# Patient Record
Sex: Female | Born: 1965 | Race: White | Hispanic: Yes | Marital: Married | Smoking: Never smoker
Health system: Southern US, Community
[De-identification: ages and names within clinical notes are randomized; demographics above are authoritative.]

## PROBLEM LIST (undated history)

## (undated) ENCOUNTER — Emergency Department (HOSPITAL_BASED_OUTPATIENT_CLINIC_OR_DEPARTMENT_OTHER): Admission: EM | Payer: BC Managed Care – PPO | Source: Home / Self Care

## (undated) DIAGNOSIS — K529 Noninfective gastroenteritis and colitis, unspecified: Secondary | ICD-10-CM

## (undated) DIAGNOSIS — G935 Compression of brain: Secondary | ICD-10-CM

## (undated) DIAGNOSIS — R739 Hyperglycemia, unspecified: Secondary | ICD-10-CM

## (undated) HISTORY — DX: Hyperglycemia, unspecified: R73.9

## (undated) HISTORY — DX: Compression of brain: G93.5

## (undated) HISTORY — PX: ABDOMINAL HYSTERECTOMY: SHX81

---

## 1988-09-24 DIAGNOSIS — N809 Endometriosis, unspecified: Secondary | ICD-10-CM

## 1988-09-24 HISTORY — DX: Endometriosis, unspecified: N80.9

## 1993-09-24 HISTORY — PX: ABDOMINAL HYSTERECTOMY: SHX81

## 1993-09-24 HISTORY — PX: APPENDECTOMY: SHX54

## 1996-09-24 HISTORY — PX: SMALL INTESTINE SURGERY: SHX150

## 1998-07-11 ENCOUNTER — Emergency Department (HOSPITAL_COMMUNITY): Admission: EM | Admit: 1998-07-11 | Discharge: 1998-07-11 | Payer: Self-pay | Admitting: Emergency Medicine

## 1998-07-25 ENCOUNTER — Encounter: Admission: RE | Admit: 1998-07-25 | Discharge: 1998-09-27 | Payer: Self-pay | Admitting: Emergency Medicine

## 1998-09-06 ENCOUNTER — Encounter (HOSPITAL_COMMUNITY): Admission: RE | Admit: 1998-09-06 | Discharge: 1998-12-05 | Payer: Self-pay | Admitting: *Deleted

## 2005-08-03 ENCOUNTER — Emergency Department (HOSPITAL_COMMUNITY): Admission: EM | Admit: 2005-08-03 | Discharge: 2005-08-03 | Payer: Self-pay | Admitting: Family Medicine

## 2006-07-17 ENCOUNTER — Other Ambulatory Visit: Admission: RE | Admit: 2006-07-17 | Discharge: 2006-07-17 | Payer: Self-pay | Admitting: Obstetrics and Gynecology

## 2007-07-14 ENCOUNTER — Encounter: Admission: RE | Admit: 2007-07-14 | Discharge: 2007-07-14 | Payer: Self-pay | Admitting: Obstetrics and Gynecology

## 2008-11-08 ENCOUNTER — Inpatient Hospital Stay (HOSPITAL_COMMUNITY): Admission: EM | Admit: 2008-11-08 | Discharge: 2008-11-11 | Payer: Self-pay | Admitting: Emergency Medicine

## 2009-06-17 ENCOUNTER — Encounter: Admission: RE | Admit: 2009-06-17 | Discharge: 2009-06-17 | Payer: Self-pay | Admitting: Obstetrics and Gynecology

## 2009-07-30 ENCOUNTER — Emergency Department (HOSPITAL_COMMUNITY): Admission: EM | Admit: 2009-07-30 | Discharge: 2009-07-31 | Payer: Self-pay | Admitting: Emergency Medicine

## 2010-10-15 ENCOUNTER — Encounter: Payer: Self-pay | Admitting: Obstetrics and Gynecology

## 2010-12-27 LAB — DIFFERENTIAL
Basophils Relative: 0 % (ref 0–1)
Eosinophils Absolute: 0 10*3/uL (ref 0.0–0.7)
Eosinophils Relative: 0 % (ref 0–5)
Lymphocytes Relative: 7 % — ABNORMAL LOW (ref 12–46)
Lymphs Abs: 1.4 10*3/uL (ref 0.7–4.0)
Monocytes Absolute: 0.6 10*3/uL (ref 0.1–1.0)
Monocytes Relative: 3 % (ref 3–12)
Neutrophils Relative %: 89 % — ABNORMAL HIGH (ref 43–77)

## 2010-12-27 LAB — CBC
Hemoglobin: 12.8 g/dL (ref 12.0–15.0)
Platelets: 221 10*3/uL (ref 150–400)
RBC: 5.54 MIL/uL — ABNORMAL HIGH (ref 3.87–5.11)

## 2010-12-27 LAB — COMPREHENSIVE METABOLIC PANEL
AST: 27 U/L (ref 0–37)
CO2: 26 mEq/L (ref 19–32)
Calcium: 9.7 mg/dL (ref 8.4–10.5)
Chloride: 102 mEq/L (ref 96–112)
GFR calc Af Amer: >60 mL/min (ref >60)
Glucose, Bld: 136 mg/dL — ABNORMAL HIGH (ref 70–99)
Potassium: 3.9 mEq/L (ref 3.5–5.1)
Sodium: 139 mEq/L (ref 135–145)
Total Bilirubin: 0.9 mg/dL (ref 0.3–1.2)
Total Protein: 7.7 g/dL (ref 6.0–8.3)

## 2010-12-27 LAB — URINALYSIS, ROUTINE W REFLEX MICROSCOPIC
Glucose, UA: NEGATIVE mg/dL
Hgb urine dipstick: NEGATIVE
Leukocytes, UA: NEGATIVE
Nitrite: NEGATIVE
Specific Gravity, Urine: 1.031 — ABNORMAL HIGH (ref 1.005–1.030)
Urobilinogen, UA: 0.2 mg/dL (ref 0.0–1.0)

## 2010-12-27 LAB — URINE CULTURE: Colony Count: NO GROWTH

## 2010-12-27 LAB — URINE MICROSCOPIC-ADD ON

## 2011-01-09 LAB — CBC
HCT: 42.3 % (ref 36.0–46.0)
HCT: 34.9 % — ABNORMAL LOW (ref 36.0–46.0)
Hemoglobin: 13.8 g/dL (ref 12.0–15.0)
Hemoglobin: 11.6 g/dL — ABNORMAL LOW (ref 12.0–15.0)
MCHC: 32.5 g/dL (ref 30.0–36.0)
MCV: 72.6 fL — ABNORMAL LOW (ref 78.0–100.0)
Platelets: 273 10*3/uL (ref 150–400)
Platelets: 213 10*3/uL (ref 150–400)
RBC: 5.82 MIL/uL — ABNORMAL HIGH (ref 3.87–5.11)
RDW: 15 % (ref 11.5–15.5)
RDW: 14.8 % (ref 11.5–15.5)
WBC: 16.8 10*3/uL — ABNORMAL HIGH (ref 4.0–10.5)
WBC: 7.1 10*3/uL (ref 4.0–10.5)

## 2011-01-09 LAB — COMPREHENSIVE METABOLIC PANEL
ALT: 16 U/L (ref 0–35)
ALT: 17 U/L (ref 0–35)
AST: 35 U/L (ref 0–37)
AST: 19 U/L (ref 0–37)
Albumin: 4.3 g/dL (ref 3.5–5.2)
Albumin: 3.3 g/dL — ABNORMAL LOW (ref 3.5–5.2)
Alkaline Phosphatase: 54 U/L (ref 39–117)
Alkaline Phosphatase: 42 U/L (ref 39–117)
BUN: 14 mg/dL (ref 6–23)
CO2: 26 mEq/L (ref 19–32)
CO2: 26 mEq/L (ref 19–32)
Calcium: 10.1 mg/dL (ref 8.4–10.5)
Chloride: 101 mEq/L (ref 96–112)
Chloride: 105 mEq/L (ref 96–112)
Creatinine, Ser: 0.77 mg/dL (ref 0.4–1.2)
Creatinine, Ser: 0.71 mg/dL (ref 0.4–1.2)
GFR calc Af Amer: >60 mL/min (ref >60)
GFR calc Af Amer: >60 mL/min (ref >60)
GFR calc non Af Amer: >60 mL/min (ref >60)
GFR calc non Af Amer: >60 mL/min (ref >60)
Glucose, Bld: 189 mg/dL — ABNORMAL HIGH (ref 70–99)
Potassium: 5.1 mEq/L (ref 3.5–5.1)
Potassium: 3.3 mEq/L — ABNORMAL LOW (ref 3.5–5.1)
Sodium: 142 mEq/L (ref 135–145)
Total Bilirubin: 1.2 mg/dL (ref 0.3–1.2)
Total Bilirubin: 0.7 mg/dL (ref 0.3–1.2)
Total Protein: 8 g/dL (ref 6.0–8.3)

## 2011-01-09 LAB — RAPID URINE DRUG SCREEN, HOSP PERFORMED
Amphetamines: NOT DETECTED
Barbiturates: NOT DETECTED
Benzodiazepines: NOT DETECTED

## 2011-01-09 LAB — URINE MICROSCOPIC-ADD ON

## 2011-01-09 LAB — DIFFERENTIAL
Basophils Absolute: 0 10*3/uL (ref 0.0–0.1)
Basophils Absolute: 0 10*3/uL (ref 0.0–0.1)
Basophils Relative: 0 % (ref 0–1)
Eosinophils Absolute: 0.1 10*3/uL (ref 0.0–0.7)
Eosinophils Relative: 0 % (ref 0–5)
Eosinophils Relative: 1 % (ref 0–5)
Lymphocytes Relative: 14 % (ref 12–46)
Lymphocytes Relative: 31 % (ref 12–46)
Lymphs Abs: 2.3 10*3/uL (ref 0.7–4.0)
Lymphs Abs: 2.2 10*3/uL (ref 0.7–4.0)
Monocytes Absolute: 0.7 10*3/uL (ref 0.1–1.0)
Monocytes Relative: 4 % (ref 3–12)
Neutro Abs: 13.7 10*3/uL — ABNORMAL HIGH (ref 1.7–7.7)
Neutro Abs: 4.3 10*3/uL (ref 1.7–7.7)
Neutrophils Relative %: 81 % — ABNORMAL HIGH (ref 43–77)

## 2011-01-09 LAB — BASIC METABOLIC PANEL
BUN: 4 mg/dL — ABNORMAL LOW (ref 6–23)
BUN: 5 mg/dL — ABNORMAL LOW (ref 6–23)
CO2: 26 mEq/L (ref 19–32)
CO2: 26 mEq/L (ref 19–32)
Calcium: 8.6 mg/dL (ref 8.4–10.5)
Calcium: 9.1 mg/dL (ref 8.4–10.5)
Chloride: 105 mEq/L (ref 96–112)
Chloride: 106 mEq/L (ref 96–112)
Creatinine, Ser: 0.6 mg/dL (ref 0.4–1.2)
Creatinine, Ser: 0.62 mg/dL (ref 0.4–1.2)
GFR calc Af Amer: >60 mL/min (ref >60)
Glucose, Bld: 90 mg/dL (ref 70–99)
Glucose, Bld: 99 mg/dL (ref 70–99)

## 2011-01-09 LAB — GLUCOSE, CAPILLARY

## 2011-01-09 LAB — POCT I-STAT, CHEM 8
BUN: 19 mg/dL (ref 6–23)
Chloride: 105 mEq/L (ref 96–112)
HCT: 47 % — ABNORMAL HIGH (ref 36.0–46.0)
Sodium: 140 mEq/L (ref 135–145)

## 2011-01-09 LAB — URINALYSIS, ROUTINE W REFLEX MICROSCOPIC
Glucose, UA: NEGATIVE mg/dL
Ketones, ur: 15 mg/dL — AB
Nitrite: NEGATIVE
Protein, ur: 100 mg/dL — AB
Urobilinogen, UA: 0.2 mg/dL (ref 0.0–1.0)

## 2011-01-09 LAB — PROTIME-INR
INR: 0.9 (ref 0.00–1.49)
Prothrombin Time: 12.2 seconds (ref 11.6–15.2)

## 2011-01-09 LAB — POTASSIUM: Potassium: 4.1 mEq/L (ref 3.5–5.1)

## 2011-01-09 LAB — POCT CARDIAC MARKERS
Myoglobin, poc: 30.9 ng/mL (ref 12–200)
Troponin i, poc: <0.05 ng/mL (ref 0.00–0.09)

## 2011-01-09 LAB — APTT: aPTT: 23 seconds — ABNORMAL LOW (ref 24–37)

## 2011-01-09 LAB — PREGNANCY, URINE: Preg Test, Ur: NEGATIVE

## 2011-02-06 NOTE — H&P (Signed)
[name], [name]            ACCOUNT NO.:  [id number]   MEDICAL RECORD NO.:  [id number]          PATIENT TYPE:  INP   LOCATION:  1823                         FACILITY:  [facility]   PHYSICIAN:  Della Goo, M.D. DATE OF BIRTH:  August 10, 1966   DATE OF ADMISSION:  11/08/2008  DATE OF DISCHARGE:                              HISTORY & PHYSICAL   PRIMARY CARE PHYSICIAN:  None.   CHIEF COMPLAINT:  Abdominal pain.   HISTORY OF PRESENT ILLNESS:  This is a 45 year old female who presents  to the emergency department secondary to complaints of sudden crampy  abdominal pain which started 30 minutes after eating a homemade soup  made of shrimp and scallops.  She states that she began to have severe  crampy abdominal pain which continued to worsen over time and then she  began to have nausea and vomiting.  She denies having any hematemesis  and also denies having any diarrhea, melena or hematochezia.  She also  denies having any fevers or chills.  Of note, the patient's husband ate  the same soup and has no symptoms.   The patient was seen in the emergency department and evaluated and was  found to be hypotensive and suffered several syncopal episodes.  The  patient was treated with IV fluid resuscitation and underwent a very  extensive workup and was referred for admission.   PAST MEDICAL HISTORY:  1. Endometriosis in the past status post hysterectomy and appendectomy      at age 32.  2. A self-reported history of a small-bowel obstruction status post      surgical resection.   MEDICATIONS:  None.   ALLERGIES:  Anaprox which causes swelling and redness.   SOCIAL HISTORY:  The patient is married.  She is a nonsmoker,  nondrinker.   FAMILY HISTORY:  Noncontributory.   REVIEW OF SYSTEMS:  Pertinents are mentioned above in the HPI.   PHYSICAL EXAMINATION FINDINGS:  GENERAL:  This is a sickly appearing 22-  year-old female who is well-nourished, well-developed, who is in  discomfort in mild distress.  VITAL SIGNS:  Temperature is 99.4 rectally, blood pressure initially  132/79 now it is 92/57, heart rate 83-94, respirations 28 now 16, O2  saturations 99-100%.  HEENT:  Examination normocephalic, atraumatic.  There is no scleral  icterus.  Pupils are equally round reactive to light.  Extraocular  movements are intact.  Funduscopic benign.  Nares are patent  bilaterally.  Oropharynx is clear.  NECK:  Supple, full range of motion.  No thyromegaly, adenopathy,  jugular venous distention.  CARDIOVASCULAR:  Regular rate and rhythm.  No murmurs, gallops or rubs.  LUNGS:  Clear to auscultation bilaterally.  ABDOMEN:  Positive bowel sounds which are mildly hypoactive.  She has  diffuse tenderness across the abdomen.  There is no rebound or guarding.  No hepatosplenomegaly.  EXTREMITIES:  Without cyanosis, clubbing or edema.  BACK:  No costovertebral angle tenderness.   LABORATORY STUDIES:  White blood cell count 16.8, hemoglobin 13.8,  hematocrit 42.3 and platelets 273, MCV 72.6, neutrophils 81%,  lymphocytes 14%.  Sodium 142, potassium 5.1, chloride 101,  bicarb 26,  BUN 14, creatinine 0.77 and glucose 189, albumin 4.3, AST 35, ALT 16,  lipase level 21.  Protime 12.2, INR 0.9 and PTT 23.  Urinalysis reveals  trace leukocyte esterase.  Urine drug screen negative.  Acute abdominal  x-rays negative for free air or obstruction and CT scan of the abdomen  and pelvis revealed jejunal thickening.  No acute changes, otherwise.  Surgical changes from a prior hysterectomy are present.   ASSESSMENT:  A 45 year old female being admitted with:  1. Abdominal pain.  2. Nausea and vomiting.  3. Jejunitis on CT scan of the abdomen and pelvis.  4. Leukocytosis.  5. Orthostatic Hypotension   PLAN:  The patient will be admitted, placed on bowel rest and antiemetic  therapy.  IV fluids have also been ordered for both rehydration and  fluid resuscitation.  The patient will be  placed on DVT and GI  prophylaxis as well.  Further workup will ensue pending results of the  patient's clinical course and her studies.      Della Goo, M.D.  Electronically Signed     HJ/MEDQ  D:  11/08/2008  T:  11/08/2008  Job:  [id number]

## 2011-02-06 NOTE — Discharge Summary (Signed)
[name], [name]            ACCOUNT NO.:  [id number]   MEDICAL RECORD NO.:  [id number]          PATIENT TYPE:  INP   LOCATION:  5507                         FACILITY:  [facility]   PHYSICIAN:  Eduard Clos, MDDATE OF BIRTH:  1966/02/15   DATE OF ADMISSION:  11/07/2008  DATE OF DISCHARGE:  11/11/2008                               DISCHARGE SUMMARY   COURSE IN THE HOSPITAL:  A 45 year old female with no significant past  medical history presented with abdominal pain after having home-made  soup of shrimp and scallops.  The pain was severe, crampy type.  The  patient on admission had a CAT scan of the abdomen and pelvis, which  showed jejunitis.   The patient was admitted to medical floor, started on IV fluids,  initiated kept n.p.o. and slowly started on clear liquid diet.  My  colleague, Marcellus Scott, did speak to a gastroenterologist who felt  that the patient probably had viral gastroenteritis and felt no  intervention is required at this time.   The patient's symptoms slowly improved, and the patient was tolerating  diet, which was advanced.  At this time, the patient is tolerating a  regular diet.  The patient also had some headache and said she did have  migraines before and improved with Fioricet.  At this time as the  patient is able to tolerate diet, pain has improved and has moved bowel,  so the patient will be discharged home.   PROCEDURE DURING THIS STAY:  CT abdomen and pelvis on November 08, 2008,  shows a jejunal enteritis, a small amount of likely reactive fluid in  the small bowel mesenteric leads.  Pelvis shows nothing acute.  The  patient has had a hysterectomy.   FINAL DIAGNOSES:  1. Possible acute gastroenteritis with jejunitis.  2. Dehydration.  3. Headache with a history of migraine.   MEDICATIONS AT DISCHARGE:  1. Omeprazole 40 mg p.o. daily.  2. Tramadol 50 mg p.o. t.i.d. p.r.n. for pain.   PLAN:  The patient is advised to follow up with  [facility] as  scheduled, to initially be on bland diet, and increase diet as  tolerated.  In the event of any recurrence of symptoms, the patient is  advised to go to the nearest ER.      Eduard Clos, MD  Electronically Signed     ANK/MEDQ  D:  11/11/2008  T:  11/12/2008  Job:  [id number]

## 2011-11-30 ENCOUNTER — Other Ambulatory Visit: Payer: Self-pay | Admitting: Emergency Medicine

## 2011-12-04 ENCOUNTER — Other Ambulatory Visit: Payer: Self-pay | Admitting: Emergency Medicine

## 2011-12-04 DIAGNOSIS — N644 Mastodynia: Secondary | ICD-10-CM

## 2011-12-10 ENCOUNTER — Ambulatory Visit
Admission: RE | Admit: 2011-12-10 | Discharge: 2011-12-10 | Disposition: A | Discharge status: Home / Self Care | Payer: Self-pay | Source: Ambulatory Visit | Attending: Emergency Medicine | Admitting: Emergency Medicine

## 2011-12-10 ENCOUNTER — Other Ambulatory Visit: Payer: Self-pay | Admitting: Emergency Medicine

## 2011-12-10 DIAGNOSIS — N644 Mastodynia: Secondary | ICD-10-CM

## 2018-03-19 ENCOUNTER — Ambulatory Visit
Admission: RE | Admit: 2018-03-19 | Discharge: 2018-03-19 | Disposition: A | Discharge status: Home / Self Care | Payer: No Typology Code available for payment source | Source: Ambulatory Visit | Attending: Physician Assistant | Admitting: Physician Assistant

## 2018-03-19 ENCOUNTER — Other Ambulatory Visit: Payer: Self-pay | Admitting: Physician Assistant

## 2018-03-19 DIAGNOSIS — Z1231 Encounter for screening mammogram for malignant neoplasm of breast: Secondary | ICD-10-CM

## 2018-10-09 ENCOUNTER — Encounter (HOSPITAL_COMMUNITY): Payer: Self-pay | Admitting: Emergency Medicine

## 2018-10-09 ENCOUNTER — Ambulatory Visit (HOSPITAL_COMMUNITY)
Admission: EM | Admit: 2018-10-09 | Discharge: 2018-10-09 | Disposition: A | Discharge status: Home / Self Care | Payer: No Typology Code available for payment source | Attending: Emergency Medicine | Admitting: Emergency Medicine

## 2018-10-09 DIAGNOSIS — N6320 Unspecified lump in the left breast, unspecified quadrant: Secondary | ICD-10-CM | POA: Insufficient documentation

## 2018-10-09 DIAGNOSIS — N644 Mastodynia: Secondary | ICD-10-CM | POA: Insufficient documentation

## 2018-10-09 MED ORDER — GABAPENTIN 300 MG PO CAPS: ORAL_CAPSULE | ORAL | 0 refills | Status: DC

## 2018-10-09 MED ORDER — IBUPROFEN 600 MG PO TABS: 600.0000 mg | ORAL_TABLET | Freq: Four times a day (QID) | ORAL | 0 refills | Status: DC | PRN

## 2018-10-09 NOTE — ED Provider Notes (Signed)
HPI  SUBJECTIVE:  [name] is a 53 y.o. female who presents with 7 months of intermittent right breast pain.  States that it lasts from several seconds up to 30 minutes at a time.  She states that is becoming more frequent and became worse today.  She describes the pain as sharp, burning, located over the entire mid right breast and extending into her axilla.  She reports a lateral palpable mass but is not sure if it is new.  She states that she does breast self exams occasionally.  No trauma, rash, history of shingles, fevers, night sweats, skin changes, nipple discharge, erythema, swelling, unintentional weight loss.  No upper back pain, cough.  She has had symptoms like this before had a negative mammogram and ultrasound in 2013 and a another negative mammogram in June 2019.  She has not tried anything for this.  Symptoms are better with holding her breast.  No aggravating factors.  It is not associated with movement of her arm.  Past medical history negative for breast surgery, cancer, diabetes.  Family history negative for breast cancer.  PMD: None.    History reviewed. No pertinent past medical history.  Past Surgical History:  Procedure Laterality Date  . ABDOMINAL HYSTERECTOMY      History reviewed. No pertinent family history.  Social History   Tobacco Use  . Smoking status: Never Smoker  . Smokeless tobacco: Never Used  Substance Use Topics  . Alcohol use: Not Currently  . Drug use: Never    No current facility-administered medications for this encounter.   Current Outpatient Medications:  .  gabapentin (NEURONTIN) 300 MG capsule, 1 tab po at bedtime 1st day, 1 tablet bid second day, then 1 tablet tid, Disp: 30 capsule, Rfl: 0 .  ibuprofen (ADVIL,MOTRIN) 600 MG tablet, Take 1 tablet (600 mg total) by mouth every 6 (six) hours as needed., Disp: 30 tablet, Rfl: 0  Allergies  Allergen Reactions  . Anaprox [Naproxen] Other (See Comments)    "I don't remember"      ROS  As noted in HPI.   Physical Exam  BP 122/70 (BP Location: Right Arm)   Pulse 83   Temp 99.5 F (37.5 C)   Resp 18   SpO2 100%   Constitutional: Well developed, well nourished, no acute distress Eyes:  EOMI, conjunctiva normal bilaterally HENT: Normocephalic, atraumatic,mucus membranes moist Respiratory: Normal inspiratory effort, lungs clear bilaterally, good air movement. Cardiovascular: Normal rate regular rhythm, no murmurs, rubs, gallops  Breasts: Breasts symmetric, no erythema, skin changes.  No nipple discharge.  No rash.  Tenderness along the midline of the right breast extending up into the axilla.  No appreciable masses. Painful tender mobile mass in the 2 o'clock position upper left quadrant of left breast.  Otherwise breast exam normal. Lymph: No axillary, supraclavicular, cervical lymphadenopathy. GI: nondistended skin: No rash, skin intact Musculoskeletal: no deformities Neurologic: Alert & oriented x 3, no focal neuro deficits Psychiatric: Speech and behavior appropriate  ED Course   Medications - No data to display  No orders of the defined types were placed in this encounter.   No results found for this or any previous visit (from the past 24 hour(s)). No results found.  ED Clinical Impression  Breast pain, right  Mass of left breast   ED Assessment/Plan  Reviewed mammogram results from 03/19/2018.  Normal except for dense breast tissue.   It does not appear to be shingles, abscess, infection.  Her right breast appears  completely normal.  Pain sounds very neuropathic, will try ibuprofen 600 mg combined with 1 g of Tylenol 3 or 4 times a day as needed, also some Neurontin.  Will provide a primary care referral list, ordering a diagnostic Mammogram with ultrasound if necessary because of the left breast mass which I assume is new.  Discussed , MDM, treatment plan, and plan for follow-up with patient.  patient agrees with plan.   Meds  ordered this encounter  Medications  . gabapentin (NEURONTIN) 300 MG capsule    Sig: 1 tab po at bedtime 1st day, 1 tablet bid second day, then 1 tablet tid    Dispense:  30 capsule    Refill:  0  . ibuprofen (ADVIL,MOTRIN) 600 MG tablet    Sig: Take 1 tablet (600 mg total) by mouth every 6 (six) hours as needed.    Dispense:  30 tablet    Refill:  0    *This clinic note was created using Scientist, clinical (histocompatibility and immunogenetics). Therefore, there may be occasional mistakes despite careful proofreading.   ?   Domenick Gong, MD 10/10/18 [phone]

## 2018-10-09 NOTE — ED Triage Notes (Signed)
Pt presents to [facility] for assessment of right breast pain since December, intermittent in nature.  But patient states pain is more intense today than it has been.  Possible lump to right side of breast, pain is more towards center, worse with palpation.  Denies nipple discharge.

## 2018-10-09 NOTE — Discharge Instructions (Addendum)
I have ordered a diagnostic mammogram with an ultrasound if necessary.  These results will need to be followed up on by a primary care provider.  You can go to the [facility] clinic or follow-up with anyone of the primary care physicians listed below.  Try the ibuprofen 600 mg combined with 1 g of Tylenol together 3 or 4 times a day as needed for pain, the Neurontin may also help.  Below is a list of primary care practices who are taking new patients for you to follow-up with. [facility] [address] [place], [place] [address] [phone]  [facility] [phone] [address] [place] [address]  [facility]: [address] [place] [address]  [phone]  [facility]: [address] [place] [address]   [phone]  [facility]: [address] [place] [address]  [phone]  [facility] : [address] [place] [address] [phone]  [facility]: [address] [place] [address] [phone]  [facility]: [address] [place] [address] [phone]  Dr. Oneal Grout [address] [facility] [place] [address]  [phone]  Dr. Jackie Plum, [facility]. [address] [place], [place] [address]  [phone]  Go to www.goodrx.com to look up your medications. This will give you a list of where you can find your prescriptions at the most affordable prices. Or ask the pharmacist what the cash price is, or if they have any other discount programs available to help make your medication more affordable. This can be less expensive than what you would pay with insurance.

## 2018-10-22 ENCOUNTER — Other Ambulatory Visit: Payer: Self-pay | Admitting: *Deleted

## 2018-10-22 DIAGNOSIS — N6320 Unspecified lump in the left breast, unspecified quadrant: Secondary | ICD-10-CM

## 2019-01-16 ENCOUNTER — Ambulatory Visit (INDEPENDENT_AMBULATORY_CARE_PROVIDER_SITE_OTHER): Payer: Self-pay | Admitting: Family Medicine

## 2019-01-16 ENCOUNTER — Other Ambulatory Visit: Payer: Self-pay

## 2019-01-16 ENCOUNTER — Encounter: Payer: Self-pay | Admitting: Family Medicine

## 2019-01-16 VITALS — BP 102/67 | HR 77 | Temp 98.4°F | Resp 14 | Ht 64.0 in | Wt 143.0 lb

## 2019-01-16 DIAGNOSIS — Z7689 Persons encountering health services in other specified circumstances: Secondary | ICD-10-CM

## 2019-01-16 DIAGNOSIS — M546 Pain in thoracic spine: Secondary | ICD-10-CM

## 2019-01-16 MED ORDER — KETOROLAC TROMETHAMINE 60 MG/2ML IM SOLN
60.0000 mg | Freq: Once | INTRAMUSCULAR | Status: AC
  Administered 2019-01-16: 60 mg via INTRAMUSCULAR

## 2019-01-16 MED ORDER — IBUPROFEN 800 MG PO TABS: 800.0000 mg | ORAL_TABLET | Freq: Three times a day (TID) | ORAL | 0 refills | Status: DC | PRN

## 2019-01-16 MED ORDER — CYCLOBENZAPRINE HCL 10 MG PO TABS: 10.0000 mg | ORAL_TABLET | Freq: Three times a day (TID) | ORAL | 0 refills | Status: DC | PRN

## 2019-01-16 NOTE — Progress Notes (Signed)
[facility]  New Patient Encounter Provider: Mike Gipndre Irish Breisch, FNP    WUJ:[id number]  NFA:[id number]  DOB - 1966/09/24  SUBJECTIVE:   Christina PattenSandra Powell, is a 53 y.o. female who presents to establish care with this clinic.   Current problems/concerns:  Patient reports having right sided pain in the neck, back and right breast x 1 month. Patient was seen in January 2020 at urgent care and had ultrasound ordered. Uninsured and did not get it performed due to cost. Patient denies histroy of familial female cancers. Patient states that she was given 600mg  ibuprofen without relief. Patient states that she had Xanax from her sister who is ill with lung cancer. She states that the xanax helps her breast pain but not the back pain.   Allergies  Allergen Reactions  . Anaprox [Naproxen] Other (See Comments)    "I don't remember"   History reviewed. No pertinent past medical history. Current Outpatient Medications on File Prior to Visit  Medication Sig Dispense Refill  . gabapentin (NEURONTIN) 300 MG capsule 1 tab po at bedtime 1st day, 1 tablet bid second day, then 1 tablet tid (Patient not taking: Reported on 01/16/2019) 30 capsule 0   No current facility-administered medications on file prior to visit.    Family History  Problem Relation Age of Onset  . Cancer Mother        liver cancer   . Cancer Sister    Social History   Socioeconomic History  . Marital status: Married    Spouse name: Not on file  . Number of children: Not on file  . Years of education: Not on file  . Highest education level: Not on file  Occupational History  . Not on file  Social Needs  . Financial resource strain: Not on file  . Food insecurity:    Worry: Not on file    Inability: Not on file  . Transportation needs:    Medical: Not on file    Non-medical: Not on file  Tobacco Use  . Smoking status: Never Smoker  . Smokeless tobacco: Never Used  Substance  and Sexual Activity  . Alcohol use: Not Currently  . Drug use: Never  . Sexual activity: Not on file  Lifestyle  . Physical activity:    Days per week: Not on file    Minutes per session: Not on file  . Stress: Not on file  Relationships  . Social connections:    Talks on phone: Not on file    Gets together: Not on file    Attends religious service: Not on file    Active member of club or organization: Not on file    Attends meetings of clubs or organizations: Not on file    Relationship status: Not on file  . Intimate partner violence:    Fear of current or ex partner: Not on file    Emotionally abused: Not on file    Physically abused: Not on file    Forced sexual activity: Not on file  Other Topics Concern  . Not on file  Social History Narrative  . Not on file    Review of Systems  Constitutional: Negative.   HENT: Negative.   Eyes: Negative.   Respiratory: Negative.   Cardiovascular: Negative.   Gastrointestinal: Negative.   Genitourinary: Negative.   Musculoskeletal: Positive for back pain (breast pain).  Skin: Negative.   Neurological: Negative.   Psychiatric/Behavioral: Negative.  OBJECTIVE:    BP 102/67 (BP Location: Right Arm, Patient Position: Sitting, Cuff Size: Normal)   Pulse 77   Temp 98.4 F (36.9 C) (Oral)   Resp 14   Ht 5\' 4"  (1.626 m)   Wt 143 lb (64.9 kg)   SpO2 100%   BMI 24.55 kg/m   Physical Exam  Constitutional: She is oriented to person, place, and time and well-developed, well-nourished, and in no distress. No distress.  HENT:  Head: Normocephalic and atraumatic.  Eyes: Pupils are equal, round, and reactive to light. Conjunctivae and EOM are normal.  Neck: Normal range of motion. Neck supple.  Cardiovascular: Normal rate, regular rhythm and intact distal pulses. Exam reveals no gallop and no friction rub.  No murmur heard. Pulmonary/Chest: Effort normal and breath sounds normal. No respiratory distress. She has no wheezes.  Right breast exhibits tenderness. Left breast exhibits tenderness.    Abdominal: Soft. Bowel sounds are normal. There is no abdominal tenderness.  Musculoskeletal: Normal range of motion.        General: No tenderness or edema.     Right shoulder: She exhibits pain and spasm.     Thoracic back: She exhibits spasm.  Lymphadenopathy:    She has no cervical adenopathy.  Neurological: She is alert and oriented to person, place, and time. Gait normal.  Skin: Skin is warm and dry.  Psychiatric: Mood, memory, affect and judgment normal.  Nursing note and vitals reviewed.    ASSESSMENT/PLAN:  1. Acute right-sided thoracic back pain - cyclobenzaprine (FLEXERIL) 10 MG tablet; Take 1 tablet (10 mg total) by mouth 3 (three) times daily as needed for muscle spasms.  Dispense: 30 tablet; Refill: 0 - ibuprofen (ADVIL) 800 MG tablet; Take 1 tablet (800 mg total) by mouth every 8 (eight) hours as needed.  Dispense: 30 tablet; Refill: 0 - ketorolac (TORADOL) injection 60 mg  2. Establishing care with new doctor, encounter for - CBC with Differential - Comprehensive metabolic panel   Return in about 6 months (around 07/18/2019).  The patient was given clear instructions to go to ER or return to medical center if symptoms don't improve, worsen or new problems develop. The patient verbalized understanding. The patient was told to call to get lab results if they haven't heard anything in the next week.     This note has been created with Education officer, environmental. Any transcriptional errors are unintentional.   Ms. Andr L. Riley Lam, FNP-BC [facility] [address] [place], [place] [address] [phone]

## 2019-01-16 NOTE — Patient Instructions (Signed)
Evening Primrose Oil can help with breast pain. You can take this at night. It is recommended that you take 3-4 grams per day.   Thoracic Strain  Thoracic strain is an injury to the muscles or tendons that attach to the upper back. A strain can be mild or severe. A mild strain may take only 1-2 weeks to heal. A severe strain involves torn muscles or tendons, so it may take 6-8 weeks to heal. Follow these instructions at home:  Rest as needed. Limit your activity as told by your doctor.  If directed, put ice on the injured area: ? Put ice in a plastic bag. ? Place a towel between your skin and the bag. ? Leave the ice on for 20 minutes, 2-3 times per day.  Take over-the-counter and prescription medicines only as told by your doctor.  Begin doing exercises as told by your doctor or physical therapist.  Warm up before being active.  Bend your knees before you lift heavy objects.  Keep all follow-up visits as told by your doctor. This is important. Contact a doctor if:  Your pain is not helped by medicine.  Your pain, bruising, or swelling is getting worse.  You have a fever. Get help right away if:  You have shortness of breath.  You have chest pain.  You have weakness or loss of feeling (numbness) in your legs.  You cannot control when you pee (urinate). This information is not intended to replace advice given to you by your health care provider. Make sure you discuss any questions you have with your health care provider. Document Released: 02/27/2008 Document Revised: 05/12/2016 Document Reviewed: 11/04/2014 Elsevier Interactive Patient Education  2019 [employer].

## 2019-01-17 LAB — COMPREHENSIVE METABOLIC PANEL
ALT: 20 IU/L (ref 0–32)
AST: 16 IU/L (ref 0–40)
Albumin/Globulin Ratio: 2 (ref 1.2–2.2)
Albumin: 4.7 g/dL (ref 3.8–4.9)
Alkaline Phosphatase: 46 IU/L (ref 39–117)
BUN/Creatinine Ratio: 24 — ABNORMAL HIGH (ref 9–23)
BUN: 16 mg/dL (ref 6–24)
Bilirubin Total: 0.3 mg/dL (ref 0.0–1.2)
CO2: 24 mmol/L (ref 20–29)
Calcium: 9.8 mg/dL (ref 8.7–10.2)
Chloride: 105 mmol/L (ref 96–106)
Creatinine, Ser: 0.67 mg/dL (ref 0.57–1.00)
GFR calc Af Amer: 117 mL/min/{1.73_m2} (ref >59)
GFR calc non Af Amer: 101 mL/min/{1.73_m2} (ref >59)
Globulin, Total: 2.4 g/dL (ref 1.5–4.5)
Glucose: 118 mg/dL — ABNORMAL HIGH (ref 65–99)
Potassium: 4.1 mmol/L (ref 3.5–5.2)
Sodium: 144 mmol/L (ref 134–144)
Total Protein: 7.1 g/dL (ref 6.0–8.5)

## 2019-01-17 LAB — CBC WITH DIFFERENTIAL/PLATELET
Basophils Absolute: 0 10*3/uL (ref 0.0–0.2)
Basos: 1 %
EOS (ABSOLUTE): 0 10*3/uL (ref 0.0–0.4)
Eos: 0 %
Hematocrit: 40 % (ref 34.0–46.6)
Hemoglobin: 12.3 g/dL (ref 11.1–15.9)
Immature Grans (Abs): 0 10*3/uL (ref 0.0–0.1)
Immature Granulocytes: 0 %
Lymphocytes Absolute: 1.7 10*3/uL (ref 0.7–3.1)
Lymphs: 22 %
MCH: 22.4 pg — ABNORMAL LOW (ref 26.6–33.0)
MCHC: 30.8 g/dL — ABNORMAL LOW (ref 31.5–35.7)
MCV: 73 fL — ABNORMAL LOW (ref 79–97)
Monocytes Absolute: 0.5 10*3/uL (ref 0.1–0.9)
Monocytes: 7 %
Neutrophils Absolute: 5.3 10*3/uL (ref 1.4–7.0)
Neutrophils: 70 %
Platelets: 261 10*3/uL (ref 150–450)
RBC: 5.49 x10E6/uL — ABNORMAL HIGH (ref 3.77–5.28)
RDW: 14.4 % (ref 11.7–15.4)
WBC: 7.6 10*3/uL (ref 3.4–10.8)

## 2019-01-19 ENCOUNTER — Telehealth: Payer: Self-pay

## 2019-01-19 NOTE — Telephone Encounter (Signed)
-----   Message from Mike Gip, FNP sent at 01/19/2019  4:08 PM EDT ----- Please let patient know that her labs show mild anemia. She can take a multivitamin with iron for this.

## 2019-01-19 NOTE — Telephone Encounter (Signed)
Called and spoke with patient, advised that labs show mild anemia and that she can take a multivitamin with iron to improve this. Patient verbalized understanding. Thanks!

## 2019-02-02 ENCOUNTER — Telehealth: Payer: Self-pay

## 2019-02-02 NOTE — Telephone Encounter (Signed)
Patient called and is saying the flexeril makes her too drowsy and she can not take it and work. She says she has taken skelaxin in the past and is asking if this she can try this instead? Please advise.

## 2019-02-03 MED ORDER — METAXALONE 800 MG PO TABS: 800.0000 mg | ORAL_TABLET | Freq: Three times a day (TID) | ORAL | 0 refills | Status: AC | PRN

## 2019-02-03 NOTE — Telephone Encounter (Signed)
Called and spoke with patient. Advised that skelaxin has been sent to pharmacy and to take as directed. Thanks!

## 2019-02-26 ENCOUNTER — Encounter: Payer: Self-pay | Admitting: Family Medicine

## 2019-04-02 ENCOUNTER — Other Ambulatory Visit: Payer: Self-pay | Admitting: *Deleted

## 2019-04-02 ENCOUNTER — Telehealth: Payer: Self-pay

## 2019-04-02 DIAGNOSIS — N644 Mastodynia: Secondary | ICD-10-CM

## 2019-04-02 NOTE — Telephone Encounter (Signed)
Patient needs an order for Mammogram. She is also experiencing pain in both breast. She was evaluated for this at last appointment. Can you put diagnostic mamo in and [place] for both breast?

## 2019-04-13 ENCOUNTER — Other Ambulatory Visit: Payer: Self-pay | Admitting: Family Medicine

## 2019-04-13 DIAGNOSIS — N644 Mastodynia: Secondary | ICD-10-CM

## 2019-04-16 ENCOUNTER — Other Ambulatory Visit: Payer: Self-pay | Admitting: *Deleted

## 2019-04-16 DIAGNOSIS — N644 Mastodynia: Secondary | ICD-10-CM

## 2019-04-17 ENCOUNTER — Ambulatory Visit
Admission: RE | Admit: 2019-04-17 | Discharge: 2019-04-17 | Disposition: A | Discharge status: Home / Self Care | Payer: No Typology Code available for payment source | Source: Ambulatory Visit | Attending: Family Medicine | Admitting: Family Medicine

## 2019-04-17 ENCOUNTER — Other Ambulatory Visit: Payer: Self-pay

## 2019-04-17 ENCOUNTER — Ambulatory Visit: Payer: Self-pay

## 2019-04-17 DIAGNOSIS — N644 Mastodynia: Secondary | ICD-10-CM

## 2019-04-20 ENCOUNTER — Other Ambulatory Visit: Payer: Self-pay

## 2019-06-03 ENCOUNTER — Encounter (HOSPITAL_COMMUNITY): Payer: Self-pay | Admitting: *Deleted

## 2019-06-03 ENCOUNTER — Encounter (HOSPITAL_COMMUNITY): Payer: Self-pay

## 2019-07-20 ENCOUNTER — Ambulatory Visit: Payer: Self-pay | Admitting: Family Medicine

## 2019-07-31 ENCOUNTER — Other Ambulatory Visit: Payer: Self-pay

## 2019-07-31 ENCOUNTER — Encounter: Payer: Self-pay | Admitting: Family Medicine

## 2019-07-31 ENCOUNTER — Ambulatory Visit (INDEPENDENT_AMBULATORY_CARE_PROVIDER_SITE_OTHER): Payer: Self-pay | Admitting: Family Medicine

## 2019-07-31 VITALS — BP 122/69 | HR 74 | Temp 97.6°F | Ht 64.0 in | Wt 146.0 lb

## 2019-07-31 DIAGNOSIS — R739 Hyperglycemia, unspecified: Secondary | ICD-10-CM

## 2019-07-31 DIAGNOSIS — Z0000 Encounter for general adult medical examination without abnormal findings: Secondary | ICD-10-CM

## 2019-07-31 DIAGNOSIS — Z09 Encounter for follow-up examination after completed treatment for conditions other than malignant neoplasm: Secondary | ICD-10-CM

## 2019-07-31 LAB — POCT URINALYSIS DIPSTICK
Bilirubin, UA: NEGATIVE
Blood, UA: NEGATIVE
Glucose, UA: NEGATIVE
Ketones, UA: NEGATIVE
Leukocytes, UA: NEGATIVE
Nitrite, UA: NEGATIVE
Protein, UA: NEGATIVE
Spec Grav, UA: >=1.03 — AB (ref 1.010–1.025)
Urobilinogen, UA: 0.2 E.U./dL
pH, UA: 5.5 (ref 5.0–8.0)

## 2019-07-31 LAB — POCT GLYCOSYLATED HEMOGLOBIN (HGB A1C): Hemoglobin A1C: 5.7 % — AB (ref 4.0–5.6)

## 2019-07-31 LAB — GLUCOSE, POCT (MANUAL RESULT ENTRY): POC Glucose: 115 mg/dl — AB (ref 70–99)

## 2019-07-31 NOTE — Progress Notes (Signed)
[facility]   Established Patient Office Visit  Subjective:  Patient ID: Christina PattenSandra Powell, female    DOB: [birth year]  Age: 53 y.o. MRN: [id number]  CC:  Chief Complaint  Patient presents with  . Follow-up    Former Debby BudAndre patient    HPI Christina PattenSandra Powell is a 53 year old female who presents for Follow Up today  Past Medical History:  Diagnosis Date  . Hyperglycemia      Current Status: This will be [name]'s initial office visit with me. Her was previously seeing Mike GipAndre Douglas, NP for her PCP needs. Since her last office visit, she is doing well with no complaints. She denies fevers, chills, fatigue, recent infections, weight loss, and night sweats. She has not had any headaches, visual changes, dizziness, and falls. No chest pain, heart palpitations, cough and shortness of breath reported. No reports of GI problems such as nausea, vomiting, diarrhea, and constipation. She has no reports of blood in stools, dysuria and hematuria. No depression or anxiety reported today. She denies pain today.   Past Surgical History:  Procedure Laterality Date  . ABDOMINAL HYSTERECTOMY      Family History  Problem Relation Age of Onset  . Cancer Mother        liver cancer   . Cancer Sister     Social History   Socioeconomic History  . Marital status: Married    Spouse name: Not on file  . Number of children: Not on file  . Years of education: Not on file  . Highest education level: Not on file  Occupational History  . Not on file  Social Needs  . Financial resource strain: Not on file  . Food insecurity    Worry: Not on file    Inability: Not on file  . Transportation needs    Medical: Not on file    Non-medical: Not on file  Tobacco Use  . Smoking status: Never Smoker  . Smokeless tobacco: Never Used  Substance and Sexual Activity  . Alcohol use: Not Currently  . Drug use: Never  . Sexual activity: Not on file  Lifestyle  .  Physical activity    Days per week: Not on file    Minutes per session: Not on file  . Stress: Not on file  Relationships  . Social Musicianconnections    Talks on phone: Not on file    Gets together: Not on file    Attends religious service: Not on file    Active member of club or organization: Not on file    Attends meetings of clubs or organizations: Not on file    Relationship status: Not on file  . Intimate partner violence    Fear of current or ex partner: Not on file    Emotionally abused: Not on file    Physically abused: Not on file    Forced sexual activity: Not on file  Other Topics Concern  . Not on file  Social History Narrative  . Not on file    Outpatient Medications Prior to Visit  Medication Sig Dispense Refill  . ibuprofen (ADVIL) 800 MG tablet Take 1 tablet (800 mg total) by mouth every 8 (eight) hours as needed. 30 tablet 0  . cyclobenzaprine (FLEXERIL) 10 MG tablet Take 1 tablet (10 mg total) by mouth 3 (three) times daily as needed for muscle spasms. 30 tablet 0  . gabapentin (NEURONTIN) 300 MG capsule 1 tab  po at bedtime 1st day, 1 tablet bid second day, then 1 tablet tid (Patient not taking: Reported on 01/16/2019) 30 capsule 0   No facility-administered medications prior to visit.     Allergies  Allergen Reactions  . Anaprox [Naproxen] Other (See Comments)    "I don't remember"    ROS Review of Systems  Constitutional: Negative.   HENT: Negative.   Eyes: Negative.   Respiratory: Negative.   Cardiovascular: Negative.   Gastrointestinal: Negative.   Endocrine: Negative.   Genitourinary: Negative.   Musculoskeletal: Negative.   Skin: Negative.   Allergic/Immunologic: Negative.   Neurological: Negative.   Hematological: Negative.   Psychiatric/Behavioral: Negative.       Objective:    Physical Exam  Constitutional: She is oriented to person, place, and time. She appears well-developed and well-nourished.  HENT:  Head: Normocephalic and  atraumatic.  Eyes: Conjunctivae are normal.  Neck: Normal range of motion. Neck supple.  Cardiovascular: Normal rate, regular rhythm, normal heart sounds and intact distal pulses.  Pulmonary/Chest: Effort normal and breath sounds normal.  Abdominal: Soft. Bowel sounds are normal.  Musculoskeletal: Normal range of motion.  Neurological: She is alert and oriented to person, place, and time. She has normal reflexes.  Skin: Skin is warm and dry.  Psychiatric: She has a normal mood and affect. Her behavior is normal. Judgment and thought content normal.  Nursing note and vitals reviewed.   BP 122/69   Pulse 74   Temp 97.6 F (36.4 C) (Oral)   Ht 5\' 4"  (1.626 m)   Wt 146 lb (66.2 kg)   SpO2 99%   BMI 25.06 kg/m  Wt Readings from Last 3 Encounters:  07/31/19 146 lb (66.2 kg)  01/16/19 143 lb (64.9 kg)     Health Maintenance Due  Topic Date Due  . HIV Screening  05/19/1981  . COLONOSCOPY  05/19/2016  . INFLUENZA VACCINE  04/25/2019    There are no preventive care reminders to display for this patient.  Lab Results  Component Value Date   TSH 3.090 07/31/2019   Lab Results  Component Value Date   WBC 7.6 01/16/2019   HGB 12.3 01/16/2019   HCT 40.0 01/16/2019   MCV 73 (L) 01/16/2019   PLT 261 01/16/2019   Lab Results  Component Value Date   NA 144 01/16/2019   K 4.1 01/16/2019   CO2 24 01/16/2019   GLUCOSE 118 (H) 01/16/2019   BUN 16 01/16/2019   CREATININE 0.67 01/16/2019   BILITOT 0.3 01/16/2019   ALKPHOS 46 01/16/2019   AST 16 01/16/2019   ALT 20 01/16/2019   PROT 7.1 01/16/2019   ALBUMIN 4.7 01/16/2019   CALCIUM 9.8 01/16/2019   Lab Results  Component Value Date   CHOL 194 07/31/2019   Lab Results  Component Value Date   HDL 50 07/31/2019   Lab Results  Component Value Date   LDLCALC 120 (H) 07/31/2019   Lab Results  Component Value Date   TRIG 135 07/31/2019   Lab Results  Component Value Date   CHOLHDL 3.9 07/31/2019   Lab Results   Component Value Date   HGBA1C 5.7 (A) 07/31/2019      Assessment & Plan:   1. Hyperglycemia Blood glucose level is 115 today. Hgb A1c is stable at 5.7. She will continue medication as prescribed, to decrease foods/beverages high in sugars and carbs and follow Heart Healthy or DASH diet. Increase physical activity to at least 30 minutes cardio exercise daily.  2. Healthcare maintenance - Urinalysis Dipstick - POCT HgB A1C - Glucose (CBG) - TSH - Lipid Panel - Vitamin B12 - Vitamin D, 25-hydroxy  3. Follow up She will follow up in 6 months.   No orders of the defined types were placed in this encounter.   Orders Placed This Encounter  Procedures  . TSH  . Lipid Panel  . Vitamin B12  . Vitamin D, 25-hydroxy  . Urinalysis Dipstick  . POCT HgB A1C  . Glucose (CBG)    Referral Orders  No referral(s) requested today    Raliegh Ip,  MSN, FNP-BC [facility] [address] [place], [place] [address] [phone]- fax   Problem List Items Addressed This Visit    None    Visit Diagnoses    Hyperglycemia    -  Primary   Healthcare maintenance       Relevant Orders   Urinalysis Dipstick (Completed)   POCT HgB A1C (Completed)   Glucose (CBG) (Completed)   TSH (Completed)   Lipid Panel (Completed)   Vitamin B12 (Completed)   Vitamin D, 25-hydroxy (Completed)   Follow up          No orders of the defined types were placed in this encounter.   Follow-up: No follow-ups on file.    Kallie Locks, FNP

## 2019-08-01 LAB — TSH: TSH: 3.09 u[IU]/mL (ref 0.450–4.500)

## 2019-08-01 LAB — LIPID PANEL
Chol/HDL Ratio: 3.9 ratio (ref 0.0–4.4)
Cholesterol, Total: 194 mg/dL (ref 100–199)
HDL: 50 mg/dL (ref >39)
LDL Chol Calc (NIH): 120 mg/dL — ABNORMAL HIGH (ref 0–99)
Triglycerides: 135 mg/dL (ref 0–149)
VLDL Cholesterol Cal: 24 mg/dL (ref 5–40)

## 2019-08-01 LAB — VITAMIN D 25 HYDROXY (VIT D DEFICIENCY, FRACTURES): Vit D, 25-Hydroxy: 27.8 ng/mL — ABNORMAL LOW (ref 30.0–100.0)

## 2019-08-01 LAB — VITAMIN B12: Vitamin B-12: 453 pg/mL (ref 232–1245)

## 2019-08-02 ENCOUNTER — Encounter: Payer: Self-pay | Admitting: Family Medicine

## 2019-08-02 DIAGNOSIS — R739 Hyperglycemia, unspecified: Secondary | ICD-10-CM | POA: Insufficient documentation

## 2020-01-23 DIAGNOSIS — R42 Dizziness and giddiness: Secondary | ICD-10-CM

## 2020-01-23 DIAGNOSIS — H9313 Tinnitus, bilateral: Secondary | ICD-10-CM

## 2020-01-23 HISTORY — DX: Dizziness and giddiness: R42

## 2020-01-23 HISTORY — DX: Tinnitus, bilateral: H93.13

## 2020-01-27 ENCOUNTER — Ambulatory Visit: Payer: No Typology Code available for payment source | Admitting: Family Medicine

## 2020-02-03 ENCOUNTER — Ambulatory Visit (INDEPENDENT_AMBULATORY_CARE_PROVIDER_SITE_OTHER): Payer: BC Managed Care – PPO | Admitting: Family Medicine

## 2020-02-03 ENCOUNTER — Other Ambulatory Visit: Payer: Self-pay

## 2020-02-03 ENCOUNTER — Encounter: Payer: Self-pay | Admitting: Family Medicine

## 2020-02-03 VITALS — BP 120/60 | HR 68 | Temp 98.0°F | Ht 64.0 in | Wt 148.2 lb

## 2020-02-03 DIAGNOSIS — R42 Dizziness and giddiness: Secondary | ICD-10-CM | POA: Insufficient documentation

## 2020-02-03 DIAGNOSIS — Z09 Encounter for follow-up examination after completed treatment for conditions other than malignant neoplasm: Secondary | ICD-10-CM

## 2020-02-03 DIAGNOSIS — H9313 Tinnitus, bilateral: Secondary | ICD-10-CM | POA: Insufficient documentation

## 2020-02-03 DIAGNOSIS — R739 Hyperglycemia, unspecified: Secondary | ICD-10-CM | POA: Diagnosis not present

## 2020-02-03 LAB — POCT URINALYSIS DIPSTICK
Bilirubin, UA: NEGATIVE
Blood, UA: NEGATIVE
Glucose, UA: NEGATIVE
Ketones, UA: NEGATIVE
Leukocytes, UA: NEGATIVE
Nitrite, UA: NEGATIVE
Protein, UA: NEGATIVE
Spec Grav, UA: 1.025 (ref 1.010–1.025)
Urobilinogen, UA: 0.2 E.U./dL
pH, UA: 7.5 (ref 5.0–8.0)

## 2020-02-03 LAB — POCT GLYCOSYLATED HEMOGLOBIN (HGB A1C): Hemoglobin A1C: 5.8 % — AB (ref 4.0–5.6)

## 2020-02-03 LAB — GLUCOSE, POCT (MANUAL RESULT ENTRY): POC Glucose: 101 mg/dl — AB (ref 70–99)

## 2020-02-03 NOTE — Progress Notes (Signed)
[facility]    Established Patient Office Visit  Subjective:  Patient ID: [name], female    DOB: 12-26-1965  Age: 54 y.o. MRN: [id number]  CC:  Chief Complaint  Patient presents with  . Follow-up    Hypeglycemia    HPI [name] is a 54 year old female who presents for Follow Up today.   Past Medical History:  Diagnosis Date  . Dizziness 01/2020  . Hyperglycemia   . Ringing in ears, bilateral 01/2020   Current Status: Since her last office visit, she is doing well with no complaints. She has chronic ringing in ears, which she also becomes dizzy. She denies fevers, chills, fatigue, recent infections, weight loss, and night sweats. She has not had any headaches, visual changes, dizziness, and falls. No chest pain, heart palpitations, cough and shortness of breath reported. Denies GI problems such as nausea, vomiting, diarrhea, and constipation. She has no reports of blood in stools, dysuria and hematuria. No depression or anxiety reported today. She denies suicidal ideations, homicidal ideations, or auditory hallucinations. She is taking all medications as prescribed. She denies pain today.   Past Surgical History:  Procedure Laterality Date  . ABDOMINAL HYSTERECTOMY      Family History  Problem Relation Age of Onset  . Cancer Mother        liver cancer   . Cancer Sister     Social History   Socioeconomic History  . Marital status: Married    Spouse name: Not on file  . Number of children: Not on file  . Years of education: Not on file  . Highest education level: Not on file  Occupational History  . Not on file  Tobacco Use  . Smoking status: Never Smoker  . Smokeless tobacco: Never Used  Substance and Sexual Activity  . Alcohol use: Not Currently  . Drug use: Never  . Sexual activity: Yes  Other Topics Concern  . Not on file  Social History Narrative  . Not on file   Social Determinants of Health    Financial Resource Strain:   . Difficulty of Paying Living Expenses:   Food Insecurity:   . Worried About Programme researcher, broadcasting/film/video in the Last Year:   . Barista in the Last Year:   Transportation Needs:   . Freight forwarder (Medical):   Marland Kitchen Lack of Transportation (Non-Medical):   Physical Activity:   . Days of Exercise per Week:   . Minutes of Exercise per Session:   Stress:   . Feeling of Stress :   Social Connections:   . Frequency of Communication with Friends and Family:   . Frequency of Social Gatherings with Friends and Family:   . Attends Religious Services:   . Active Member of Clubs or Organizations:   . Attends Banker Meetings:   Marland Kitchen Marital Status:   Intimate Partner Violence:   . Fear of Current or Ex-Partner:   . Emotionally Abused:   Marland Kitchen Physically Abused:   . Sexually Abused:     Outpatient Medications Prior to Visit  Medication Sig Dispense Refill  . ibuprofen (ADVIL) 800 MG tablet Take 1 tablet (800 mg total) by mouth every 8 (eight) hours as needed. 30 tablet 0   No facility-administered medications prior to visit.    Allergies  Allergen Reactions  . Anaprox [Naproxen] Other (See Comments)    "I don't remember"    ROS  Review of Systems  Constitutional: Negative.   HENT: Negative.        Ringing in ear.  Eyes: Negative.   Respiratory: Negative.   Cardiovascular: Negative.   Gastrointestinal: Negative.   Endocrine: Negative.   Genitourinary: Negative.   Musculoskeletal: Negative.   Skin: Negative.   Allergic/Immunologic: Negative.   Neurological: Positive for dizziness (occasional ) and headaches (occasional ).  Hematological: Negative.       Objective:    Physical Exam  Constitutional: She is oriented to person, place, and time. She appears well-developed and well-nourished.  HENT:  Head: Normocephalic and atraumatic.  Eyes: Conjunctivae are normal.  Cardiovascular: Normal rate and regular rhythm.  Pulmonary/Chest:  Effort normal and breath sounds normal.  Abdominal: Soft. Bowel sounds are normal.  Musculoskeletal:        General: Normal range of motion.     Cervical back: Normal range of motion and neck supple.  Neurological: She is alert and oriented to person, place, and time. She has normal reflexes.  Skin: Skin is warm and dry.  Psychiatric: She has a normal mood and affect. Her behavior is normal. Judgment and thought content normal.  Nursing note and vitals reviewed.   BP 120/60   Pulse 68   Temp 98 F (36.7 C)   Ht 5\' 4"  (1.626 m)   Wt 148 lb 3.2 oz (67.2 kg)   SpO2 100%   BMI 25.44 kg/m  Wt Readings from Last 3 Encounters:  02/03/20 148 lb 3.2 oz (67.2 kg)  07/31/19 146 lb (66.2 kg)  01/16/19 143 lb (64.9 kg)     Health Maintenance Due  Topic Date Due  . HIV Screening  Never done  . TETANUS/TDAP  Never done  . PAP SMEAR-Modifier  Never done  . COLONOSCOPY  Never done    There are no preventive care reminders to display for this patient.  Lab Results  Component Value Date   TSH 3.090 07/31/2019   Lab Results  Component Value Date   WBC 7.6 01/16/2019   HGB 12.3 01/16/2019   HCT 40.0 01/16/2019   MCV 73 (L) 01/16/2019   PLT 261 01/16/2019   Lab Results  Component Value Date   NA 144 01/16/2019   K 4.1 01/16/2019   CO2 24 01/16/2019   GLUCOSE 118 (H) 01/16/2019   BUN 16 01/16/2019   CREATININE 0.67 01/16/2019   BILITOT 0.3 01/16/2019   ALKPHOS 46 01/16/2019   AST 16 01/16/2019   ALT 20 01/16/2019   PROT 7.1 01/16/2019   ALBUMIN 4.7 01/16/2019   CALCIUM 9.8 01/16/2019   Lab Results  Component Value Date   CHOL 194 07/31/2019   Lab Results  Component Value Date   HDL 50 07/31/2019   Lab Results  Component Value Date   LDLCALC 120 (H) 07/31/2019   Lab Results  Component Value Date   TRIG 135 07/31/2019   Lab Results  Component Value Date   CHOLHDL 3.9 07/31/2019   Lab Results  Component Value Date   HGBA1C 5.8 (A) 02/03/2020       Assessment & Plan:   1. Ringing in ear, bilateral - Ambulatory referral to ENT  2. Dizziness - Ambulatory referral to ENT  3. Hyperglycemia - POCT urinalysis dipstick - POCT glycosylated hemoglobin (Hb A1C) - POCT glucose (manual entry)  4. Follow up She will follow up in 6 months.   No orders of the defined types were placed in this encounter.   Orders Placed This  Encounter  Procedures  . Ambulatory referral to ENT  . POCT urinalysis dipstick  . POCT glycosylated hemoglobin (Hb A1C)  . POCT glucose (manual entry)     Referral Orders     Ambulatory referral to ENT   Kathe Becton,  MSN, FNP-BC [address], [place] [address] [phone]- fax   Problem List Items Addressed This Visit      Other   Hyperglycemia   Relevant Orders   POCT urinalysis dipstick (Completed)   POCT glycosylated hemoglobin (Hb A1C) (Completed)   POCT glucose (manual entry) (Completed)    Other Visit Diagnoses    Ringing in ear, bilateral    -  Primary   Relevant Orders   Ambulatory referral to ENT   Dizziness       Relevant Orders   Ambulatory referral to ENT   Follow up          No orders of the defined types were placed in this encounter.   Follow-up: Return in about 6 months (around 08/05/2020).    Azzie Glatter, FNP

## 2020-04-05 ENCOUNTER — Encounter (INDEPENDENT_AMBULATORY_CARE_PROVIDER_SITE_OTHER): Payer: Self-pay | Admitting: Otolaryngology

## 2020-04-05 ENCOUNTER — Ambulatory Visit (INDEPENDENT_AMBULATORY_CARE_PROVIDER_SITE_OTHER): Payer: BC Managed Care – PPO | Admitting: Otolaryngology

## 2020-04-05 ENCOUNTER — Other Ambulatory Visit: Payer: Self-pay

## 2020-04-05 VITALS — Temp 97.7°F

## 2020-04-05 DIAGNOSIS — H9313 Tinnitus, bilateral: Secondary | ICD-10-CM

## 2020-04-05 DIAGNOSIS — H93233 Hyperacusis, bilateral: Secondary | ICD-10-CM | POA: Diagnosis not present

## 2020-04-05 NOTE — Progress Notes (Signed)
HPI: [name] is a 54 y.o. female who presents is referred by her PCP for evaluation of complaints of intermittent ringing in her ears that she has had for a number of years.  She also complains of certain loud sounds being real bothersome to her and will sometimes make her dizzy.  She does not have any dizziness problems at night.  However loud sounds are bothersome to her.  She has not had a hearing test but has not noted any hearing problems. Denies exposure to loud sounds or explosions..  Past Medical History:  Diagnosis Date  . Dizziness 01/2020  . Hyperglycemia   . Ringing in ears, bilateral 01/2020   Past Surgical History:  Procedure Laterality Date  . ABDOMINAL HYSTERECTOMY     Social History   Socioeconomic History  . Marital status: Married    Spouse name: Not on file  . Number of children: Not on file  . Years of education: Not on file  . Highest education level: Not on file  Occupational History  . Not on file  Tobacco Use  . Smoking status: Never Smoker  . Smokeless tobacco: Never Used  Vaping Use  . Vaping Use: Never used  Substance and Sexual Activity  . Alcohol use: Not Currently  . Drug use: Never  . Sexual activity: Yes  Other Topics Concern  . Not on file  Social History Narrative  . Not on file   Social Determinants of Health   Financial Resource Strain:   . Difficulty of Paying Living Expenses:   Food Insecurity:   . Worried About Programme researcher, broadcasting/film/video in the Last Year:   . Barista in the Last Year:   Transportation Needs:   . Freight forwarder (Medical):   Marland Kitchen Lack of Transportation (Non-Medical):   Physical Activity:   . Days of Exercise per Week:   . Minutes of Exercise per Session:   Stress:   . Feeling of Stress :   Social Connections:   . Frequency of Communication with Friends and Family:   . Frequency of Social Gatherings with Friends and Family:   . Attends Religious Services:   . Active Member of Clubs or  Organizations:   . Attends Banker Meetings:   Marland Kitchen Marital Status:    Family History  Problem Relation Age of Onset  . Cancer Mother        liver cancer   . Cancer Sister    Allergies  Allergen Reactions  . Anaprox [Naproxen] Other (See Comments)    "I don't remember"   Prior to Admission medications   Medication Sig Start Date End Date Taking? Authorizing Provider  ibuprofen (ADVIL) 800 MG tablet Take 1 tablet (800 mg total) by mouth every 8 (eight) hours as needed. 01/16/19   Mike Gip, FNP     Positive ROS: Otherwise negative  All other systems have been reviewed and were otherwise negative with the exception of those mentioned in the HPI and as above.  Physical Exam: Constitutional: Alert, well-appearing, no acute distress Ears: External ears without lesions or tenderness. Ear canals are clear bilaterally with intact, clear TMs bilaterally.  On Dix-Hallpike testing she had no evidence of BPPV.  Auscultation of the ears revealed no objective tinnitus or sounds. Nasal: External nose without lesions. Clear nasal passages Oral: Lips and gums without lesions. Tongue and palate mucosa without lesions. Posterior oropharynx clear. Neck: No palpable adenopathy or masses Respiratory: Breathing comfortably  Skin: No  facial/neck lesions or rash noted.  Audiogram in the office today demonstrated normal hearing in both ears with SRT's of 10 dB bilaterally.  She had type A tympanograms bilaterally.  Procedures  Assessment: Tinnitus and hyperacusis.  Plan: Reviewed with patient concerning limited treatment options for tinnitus and her symptoms.  Reviewed with her concerning using masking noise when the tinnitus is bothersome.  Presently she is doing fairly well with the complaints but gave her information on referral to Tinnitus and sound sensitivity clinic held at [facility] if needed or interested in attending.   Narda Bonds, MD   CC:

## 2020-04-19 ENCOUNTER — Encounter (INDEPENDENT_AMBULATORY_CARE_PROVIDER_SITE_OTHER): Payer: Self-pay

## 2020-05-13 ENCOUNTER — Inpatient Hospital Stay (HOSPITAL_COMMUNITY): Payer: BC Managed Care – PPO

## 2020-05-13 ENCOUNTER — Encounter (HOSPITAL_COMMUNITY): Payer: Self-pay

## 2020-05-13 ENCOUNTER — Emergency Department (HOSPITAL_COMMUNITY): Payer: BC Managed Care – PPO

## 2020-05-13 ENCOUNTER — Inpatient Hospital Stay (HOSPITAL_COMMUNITY)
Admission: EM | Admit: 2020-05-13 | Discharge: 2020-05-19 | DRG: 390 | Disposition: A | Discharge status: Home / Self Care | Payer: BC Managed Care – PPO | Attending: General Surgery | Admitting: General Surgery

## 2020-05-13 ENCOUNTER — Other Ambulatory Visit: Payer: Self-pay

## 2020-05-13 DIAGNOSIS — R1013 Epigastric pain: Secondary | ICD-10-CM | POA: Diagnosis not present

## 2020-05-13 DIAGNOSIS — Z20822 Contact with and (suspected) exposure to covid-19: Secondary | ICD-10-CM | POA: Diagnosis not present

## 2020-05-13 DIAGNOSIS — K56609 Unspecified intestinal obstruction, unspecified as to partial versus complete obstruction: Principal | ICD-10-CM | POA: Diagnosis present

## 2020-05-13 DIAGNOSIS — Z9049 Acquired absence of other specified parts of digestive tract: Secondary | ICD-10-CM

## 2020-05-13 DIAGNOSIS — Z90710 Acquired absence of both cervix and uterus: Secondary | ICD-10-CM | POA: Diagnosis not present

## 2020-05-13 DIAGNOSIS — Z4682 Encounter for fitting and adjustment of non-vascular catheter: Secondary | ICD-10-CM | POA: Diagnosis not present

## 2020-05-13 DIAGNOSIS — R1084 Generalized abdominal pain: Secondary | ICD-10-CM | POA: Diagnosis not present

## 2020-05-13 DIAGNOSIS — K6389 Other specified diseases of intestine: Secondary | ICD-10-CM | POA: Diagnosis not present

## 2020-05-13 DIAGNOSIS — R519 Headache, unspecified: Secondary | ICD-10-CM | POA: Diagnosis not present

## 2020-05-13 DIAGNOSIS — K56699 Other intestinal obstruction unspecified as to partial versus complete obstruction: Secondary | ICD-10-CM | POA: Diagnosis not present

## 2020-05-13 DIAGNOSIS — I700 Atherosclerosis of aorta: Secondary | ICD-10-CM | POA: Diagnosis not present

## 2020-05-13 DIAGNOSIS — Z886 Allergy status to analgesic agent status: Secondary | ICD-10-CM | POA: Diagnosis not present

## 2020-05-13 DIAGNOSIS — R52 Pain, unspecified: Secondary | ICD-10-CM | POA: Diagnosis not present

## 2020-05-13 DIAGNOSIS — K660 Peritoneal adhesions (postprocedural) (postinfection): Secondary | ICD-10-CM | POA: Diagnosis not present

## 2020-05-13 DIAGNOSIS — K5669 Other partial intestinal obstruction: Secondary | ICD-10-CM | POA: Diagnosis not present

## 2020-05-13 DIAGNOSIS — K5989 Other specified functional intestinal disorders: Secondary | ICD-10-CM | POA: Diagnosis not present

## 2020-05-13 DIAGNOSIS — I959 Hypotension, unspecified: Secondary | ICD-10-CM | POA: Diagnosis not present

## 2020-05-13 DIAGNOSIS — R1110 Vomiting, unspecified: Secondary | ICD-10-CM | POA: Diagnosis not present

## 2020-05-13 DIAGNOSIS — K5939 Other megacolon: Secondary | ICD-10-CM | POA: Diagnosis not present

## 2020-05-13 DIAGNOSIS — Z0189 Encounter for other specified special examinations: Secondary | ICD-10-CM

## 2020-05-13 DIAGNOSIS — I10 Essential (primary) hypertension: Secondary | ICD-10-CM | POA: Diagnosis not present

## 2020-05-13 HISTORY — DX: Noninfective gastroenteritis and colitis, unspecified: K52.9

## 2020-05-13 HISTORY — DX: Unspecified intestinal obstruction, unspecified as to partial versus complete obstruction: K56.609

## 2020-05-13 LAB — CBC WITH DIFFERENTIAL/PLATELET
Abs Immature Granulocytes: 0.06 10*3/uL (ref 0.00–0.07)
Basophils Absolute: 0 10*3/uL (ref 0.0–0.1)
Basophils Relative: 0 %
Eosinophils Absolute: 0 10*3/uL (ref 0.0–0.5)
Eosinophils Relative: 0 %
HCT: 43.3 % (ref 36.0–46.0)
Hemoglobin: 13.4 g/dL (ref 12.0–15.0)
Immature Granulocytes: 0 %
Lymphocytes Relative: 5 %
Lymphs Abs: 0.7 10*3/uL (ref 0.7–4.0)
MCH: 22.7 pg — ABNORMAL LOW (ref 26.0–34.0)
MCHC: 30.9 g/dL (ref 30.0–36.0)
MCV: 73.4 fL — ABNORMAL LOW (ref 80.0–100.0)
Monocytes Absolute: 0.5 10*3/uL (ref 0.1–1.0)
Monocytes Relative: 4 %
Neutro Abs: 12.3 10*3/uL — ABNORMAL HIGH (ref 1.7–7.7)
Neutrophils Relative %: 91 %
Platelets: 266 10*3/uL (ref 150–400)
RBC: 5.9 MIL/uL — ABNORMAL HIGH (ref 3.87–5.11)
RDW: 14.8 % (ref 11.5–15.5)
WBC: 13.5 10*3/uL — ABNORMAL HIGH (ref 4.0–10.5)
nRBC: 0 % (ref 0.0–0.2)

## 2020-05-13 LAB — COMPREHENSIVE METABOLIC PANEL
ALT: 15 U/L (ref 0–44)
AST: 19 U/L (ref 15–41)
Albumin: 4.4 g/dL (ref 3.5–5.0)
Alkaline Phosphatase: 42 U/L (ref 38–126)
Anion gap: 12 (ref 5–15)
BUN: 16 mg/dL (ref 6–20)
CO2: 22 mmol/L (ref 22–32)
Calcium: 9.3 mg/dL (ref 8.9–10.3)
Chloride: 104 mmol/L (ref 98–111)
Creatinine, Ser: 0.68 mg/dL (ref 0.44–1.00)
GFR calc Af Amer: >60 mL/min (ref >60)
GFR calc non Af Amer: >60 mL/min (ref >60)
Glucose, Bld: 166 mg/dL — ABNORMAL HIGH (ref 70–99)
Potassium: 4.1 mmol/L (ref 3.5–5.1)
Sodium: 138 mmol/L (ref 135–145)
Total Bilirubin: 0.6 mg/dL (ref 0.3–1.2)
Total Protein: 7.6 g/dL (ref 6.5–8.1)

## 2020-05-13 LAB — URINALYSIS, ROUTINE W REFLEX MICROSCOPIC
Bilirubin Urine: NEGATIVE
Glucose, UA: NEGATIVE mg/dL
Hgb urine dipstick: NEGATIVE
Ketones, ur: NEGATIVE mg/dL
Leukocytes,Ua: NEGATIVE
Nitrite: NEGATIVE
Protein, ur: NEGATIVE mg/dL
Specific Gravity, Urine: 1.038 — ABNORMAL HIGH (ref 1.005–1.030)
pH: 7 (ref 5.0–8.0)

## 2020-05-13 LAB — SARS CORONAVIRUS 2 BY RT PCR (HOSPITAL ORDER, PERFORMED IN ~~LOC~~ HOSPITAL LAB): SARS Coronavirus 2: NEGATIVE

## 2020-05-13 LAB — LIPASE, BLOOD: Lipase: 22 U/L (ref 11–51)

## 2020-05-13 MED ORDER — MENTHOL 3 MG MT LOZG: 1.0000 | LOZENGE | OROMUCOSAL | Status: DC | PRN

## 2020-05-13 MED ORDER — LIP MEDEX EX OINT
1.0000 "application " | TOPICAL_OINTMENT | Freq: Two times a day (BID) | CUTANEOUS | Status: DC
  Administered 2020-05-13 – 2020-05-19 (×13): 1 via TOPICAL
  Filled 2020-05-13: qty 7

## 2020-05-13 MED ORDER — MORPHINE SULFATE (PF) 4 MG/ML IV SOLN
4.0000 mg | Freq: Once | INTRAVENOUS | Status: AC
  Administered 2020-05-13: 4 mg via INTRAVENOUS
  Filled 2020-05-13: qty 1

## 2020-05-13 MED ORDER — MORPHINE SULFATE (PF) 2 MG/ML IV SOLN
2.0000 mg | INTRAVENOUS | Status: DC | PRN
  Administered 2020-05-13: 2 mg via INTRAVENOUS
  Filled 2020-05-13: qty 1

## 2020-05-13 MED ORDER — ENOXAPARIN SODIUM 40 MG/0.4ML ~~LOC~~ SOLN
40.0000 mg | SUBCUTANEOUS | Status: DC
  Administered 2020-05-13 – 2020-05-18 (×6): 40 mg via SUBCUTANEOUS
  Filled 2020-05-13 (×6): qty 0.4

## 2020-05-13 MED ORDER — METOPROLOL TARTRATE 5 MG/5ML IV SOLN: 5.0000 mg | Freq: Four times a day (QID) | INTRAVENOUS | Status: DC | PRN

## 2020-05-13 MED ORDER — LACTATED RINGERS IV BOLUS: 1000.0000 mL | Freq: Three times a day (TID) | INTRAVENOUS | Status: AC | PRN

## 2020-05-13 MED ORDER — MAGIC MOUTHWASH
15.0000 mL | Freq: Four times a day (QID) | ORAL | Status: DC | PRN
  Filled 2020-05-13: qty 15

## 2020-05-13 MED ORDER — ONDANSETRON HCL 4 MG/2ML IJ SOLN
4.0000 mg | Freq: Once | INTRAMUSCULAR | Status: AC
  Administered 2020-05-13: 4 mg via INTRAVENOUS
  Filled 2020-05-13: qty 2

## 2020-05-13 MED ORDER — SODIUM CHLORIDE 0.9 % IV BOLUS
1000.0000 mL | Freq: Once | INTRAVENOUS | Status: AC
  Administered 2020-05-13: 1000 mL via INTRAVENOUS

## 2020-05-13 MED ORDER — FAMOTIDINE IN NACL 20-0.9 MG/50ML-% IV SOLN
20.0000 mg | Freq: Once | INTRAVENOUS | Status: AC
  Administered 2020-05-13: 20 mg via INTRAVENOUS
  Filled 2020-05-13: qty 50

## 2020-05-13 MED ORDER — PHENOL 1.4 % MT LIQD
2.0000 | OROMUCOSAL | Status: DC | PRN
  Filled 2020-05-13 (×2): qty 177

## 2020-05-13 MED ORDER — SODIUM CHLORIDE 0.9 % IV SOLN: Freq: Once | INTRAVENOUS | Status: AC

## 2020-05-13 MED ORDER — DIPHENHYDRAMINE HCL 50 MG/ML IJ SOLN
25.0000 mg | Freq: Four times a day (QID) | INTRAMUSCULAR | Status: DC | PRN
  Administered 2020-05-14: 25 mg via INTRAVENOUS
  Filled 2020-05-13 (×2): qty 1

## 2020-05-13 MED ORDER — SIMETHICONE 40 MG/0.6ML PO SUSP
40.0000 mg | Freq: Four times a day (QID) | ORAL | Status: DC | PRN
  Administered 2020-05-16: 40 mg via ORAL
  Filled 2020-05-13 (×3): qty 0.6

## 2020-05-13 MED ORDER — SODIUM CHLORIDE 0.9 % IV SOLN: INTRAVENOUS | Status: DC

## 2020-05-13 MED ORDER — BISACODYL 10 MG RE SUPP
10.0000 mg | Freq: Every day | RECTAL | Status: DC
  Administered 2020-05-13 – 2020-05-19 (×6): 10 mg via RECTAL
  Filled 2020-05-13 (×6): qty 1

## 2020-05-13 MED ORDER — PROCHLORPERAZINE EDISYLATE 10 MG/2ML IJ SOLN: 5.0000 mg | INTRAMUSCULAR | Status: DC | PRN

## 2020-05-13 MED ORDER — ONDANSETRON 4 MG PO TBDP: 4.0000 mg | ORAL_TABLET | Freq: Four times a day (QID) | ORAL | Status: DC | PRN

## 2020-05-13 MED ORDER — DIATRIZOATE MEGLUMINE & SODIUM 66-10 % PO SOLN
90.0000 mL | Freq: Once | ORAL | Status: AC
  Administered 2020-05-13: 90 mL via NASOGASTRIC
  Filled 2020-05-13 (×2): qty 90

## 2020-05-13 MED ORDER — SODIUM CHLORIDE (PF) 0.9 % IJ SOLN
INTRAMUSCULAR | Status: AC
  Filled 2020-05-13: qty 50

## 2020-05-13 MED ORDER — ACETAMINOPHEN 650 MG RE SUPP: 650.0000 mg | Freq: Four times a day (QID) | RECTAL | Status: DC | PRN

## 2020-05-13 MED ORDER — HYDROMORPHONE HCL 1 MG/ML IJ SOLN
1.0000 mg | INTRAMUSCULAR | Status: DC | PRN
  Administered 2020-05-13 – 2020-05-17 (×18): 1 mg via INTRAVENOUS
  Filled 2020-05-13 (×19): qty 1

## 2020-05-13 MED ORDER — ACETAMINOPHEN 325 MG PO TABS: 650.0000 mg | ORAL_TABLET | Freq: Four times a day (QID) | ORAL | Status: DC | PRN

## 2020-05-13 MED ORDER — LACTATED RINGERS IV BOLUS
1000.0000 mL | Freq: Once | INTRAVENOUS | Status: AC
  Administered 2020-05-13: 1000 mL via INTRAVENOUS

## 2020-05-13 MED ORDER — ONDANSETRON HCL 4 MG/2ML IJ SOLN
4.0000 mg | Freq: Four times a day (QID) | INTRAMUSCULAR | Status: DC | PRN
  Administered 2020-05-13: 4 mg via INTRAVENOUS
  Filled 2020-05-13 (×3): qty 2

## 2020-05-13 MED ORDER — IOHEXOL 300 MG/ML  SOLN
100.0000 mL | Freq: Once | INTRAMUSCULAR | Status: AC | PRN
  Administered 2020-05-13: 100 mL via INTRAVENOUS

## 2020-05-13 MED ORDER — ALUM & MAG HYDROXIDE-SIMETH 200-200-20 MG/5ML PO SUSP
30.0000 mL | Freq: Four times a day (QID) | ORAL | Status: DC | PRN
  Administered 2020-05-17: 30 mL via ORAL
  Filled 2020-05-13: qty 30

## 2020-05-13 MED ORDER — DIPHENHYDRAMINE HCL 25 MG PO CAPS: 25.0000 mg | ORAL_CAPSULE | Freq: Four times a day (QID) | ORAL | Status: DC | PRN

## 2020-05-13 MED ORDER — PANTOPRAZOLE SODIUM 40 MG IV SOLR
40.0000 mg | Freq: Every day | INTRAVENOUS | Status: DC
  Administered 2020-05-13 – 2020-05-18 (×6): 40 mg via INTRAVENOUS
  Filled 2020-05-13 (×6): qty 40

## 2020-05-13 MED ORDER — METHOCARBAMOL 1000 MG/10ML IJ SOLN
500.0000 mg | Freq: Four times a day (QID) | INTRAVENOUS | Status: DC | PRN
  Filled 2020-05-13: qty 5

## 2020-05-13 NOTE — ED Provider Notes (Signed)
[facility]-EMERGENCY DEPT Provider Note   CSN: [id number] Arrival date & time: 05/13/20  3419     History Chief Complaint  Patient presents with  . Abdominal Pain    [name] is a 54 y.o. female.  [name] is a 54 y.o. female with a history of dizziness and hyperglycemia, who presents via EMS for evaluation of abdominal pain.  Patient states that pain started between 9 and 10 PM last night.  She reports that is a constant severe pain primarily in the periumbilical region.  She states that she has had 2 episodes of vomiting, last night, none this morning but continues to feel nauseated.  She states she has had chills but no measured fever.  Last bowel movement was 3 PM yesterday prior to pain starting and was normal, states she has not been able to pass gas, denies hematochezia or melena.  She denies urinary symptoms.  States that she had a surgery 25 years ago [place] states that they took out her uterus but then she had a blockage and they had to remove a part of her intestine she thinks, no surgery since then.  She has not taken any medications to treat her symptoms prior to arrival, and denies any other aggravating or alleviating factors.        Past Medical History:  Diagnosis Date  . Dizziness 01/2020  . Hyperglycemia   . Ringing in ears, bilateral 01/2020    Patient Active Problem List   Diagnosis Date Noted  . SBO (small bowel obstruction) (HCC) 05/13/2020  . Ringing in ear, bilateral 02/03/2020  . Dizziness 02/03/2020  . Hyperglycemia 08/02/2019    Past Surgical History:  Procedure Laterality Date  . ABDOMINAL HYSTERECTOMY       OB History    Gravida  0   Para  0   Term  0   Preterm  0   AB  0   Living  0     SAB  0   TAB  0   Ectopic  0   Multiple  0   Live Births  0           Family History  Problem Relation Age of Onset  . Cancer Mother        liver cancer   . Cancer Sister     Social  History   Tobacco Use  . Smoking status: Never Smoker  . Smokeless tobacco: Never Used  Vaping Use  . Vaping Use: Never used  Substance Use Topics  . Alcohol use: Not Currently  . Drug use: Never    Home Medications Prior to Admission medications   Medication Sig Start Date End Date Taking? Authorizing Provider  naproxen sodium (ALEVE) 220 MG tablet Take 220 mg by mouth 2 (two) times daily as needed (pain).   Yes [provider]  ibuprofen (ADVIL) 800 MG tablet Take 1 tablet (800 mg total) by mouth every 8 (eight) hours as needed. Patient not taking: Reported on 05/13/2020 01/16/19   Mike Gip, FNP    Allergies    Anaprox [naproxen]  Review of Systems   Review of Systems  Constitutional: Positive for chills. Negative for fever.  HENT: Negative for congestion, rhinorrhea and sore throat.   Respiratory: Negative for cough and shortness of breath.   Cardiovascular: Negative for chest pain.  Gastrointestinal: Positive for abdominal pain, nausea and vomiting. Negative for blood in stool, constipation and diarrhea.  Genitourinary: Negative for  dysuria and frequency.  Musculoskeletal: Negative for arthralgias and myalgias.  Skin: Negative for color change and rash.    Physical Exam Updated Vital Signs BP 122/60 (BP Location: Right Arm)   Pulse 90   Temp 98.2 F (36.8 C) (Oral)   Resp 20   SpO2 93%   Physical Exam Vitals and nursing note reviewed.  Constitutional:      General: She is not in acute distress.    Appearance: She is well-developed. She is ill-appearing. She is not diaphoretic.     Comments: Patient appears uncomfortable and is ill-appearing but in no acute distress  HENT:     Head: Normocephalic and atraumatic.     Mouth/Throat:     Mouth: Mucous membranes are moist.     Pharynx: Oropharynx is clear.  Eyes:     General:        Right eye: No discharge.        Left eye: No discharge.     Pupils: Pupils are equal, round, and reactive to light.    Cardiovascular:     Rate and Rhythm: Normal rate and regular rhythm.     Heart sounds: Normal heart sounds. No murmur heard.  No friction rub. No gallop.   Pulmonary:     Effort: Pulmonary effort is normal. No respiratory distress.     Breath sounds: Normal breath sounds. No wheezing or rales.     Comments: Respirations equal and unlabored, patient able to speak in full sentences, lungs clear to auscultation bilaterally Abdominal:     General: Bowel sounds are normal. There is distension.     Palpations: Abdomen is soft. There is no mass.     Tenderness: There is abdominal tenderness in the epigastric area and periumbilical area. There is no guarding.     Comments: Abdomen soft, mildly distended, hypoactive bowel sounds, there is tenderness primarily in the epigastric and periumbilical regions with slight guarding, no rebound tenderness or peritoneal signs.  Some mild lower abdominal tenderness noted without guarding.  Musculoskeletal:        General: No deformity.     Cervical back: Neck supple.  Skin:    General: Skin is warm and dry.     Capillary Refill: Capillary refill takes less than 2 seconds.  Neurological:     Mental Status: She is alert.     Coordination: Coordination normal.     Comments: Speech is clear, able to follow commands Moves extremities without ataxia, coordination intact  Psychiatric:        Mood and Affect: Mood is anxious.        Behavior: Behavior normal.     ED Results / Procedures / Treatments   Labs (all labs ordered are listed, but only abnormal results are displayed) Labs Reviewed  COMPREHENSIVE METABOLIC PANEL - Abnormal; Notable for the following components:      Result Value   Glucose, Bld 166 (*)    All other components within normal limits  CBC WITH DIFFERENTIAL/PLATELET - Abnormal; Notable for the following components:   WBC 13.5 (*)    RBC 5.90 (*)    MCV 73.4 (*)    MCH 22.7 (*)    Neutro Abs 12.3 (*)    All other components within  normal limits  URINALYSIS, ROUTINE W REFLEX MICROSCOPIC - Abnormal; Notable for the following components:   Color, Urine COLORLESS (*)    Specific Gravity, Urine 1.038 (*)    All other components within normal limits  SARS  CORONAVIRUS 2 BY RT PCR [facility])  LIPASE, BLOOD    EKG EKG Interpretation  Date/Time:  Friday May 13 2020 09:17:19 EDT Ventricular Rate:  70 PR Interval:    QRS Duration: 91 QT Interval:  431 QTC Calculation: 466 R Axis:   85 Text Interpretation: Sinus rhythm Probable left atrial enlargement Confirmed by Virgina NorfolkAdam, [place] [phone]([id number]) on 05/13/2020 9:23:26 AM   Radiology CT Abdomen Pelvis W Contrast  Result Date: 05/13/2020 CLINICAL DATA:  Periumbilical abdominal pain with nausea and vomiting. History of prior bowel resection. EXAM: CT ABDOMEN AND PELVIS WITH CONTRAST TECHNIQUE: Multidetector CT imaging of the abdomen and pelvis was performed using the standard protocol following bolus administration of intravenous contrast. CONTRAST:  100mL OMNIPAQUE IOHEXOL 300 MG/ML  SOLN COMPARISON:  11/08/2008 FINDINGS: Lower chest: 6 mm nodular density within the lingula (series 5, image 9) appears slightly more prominent compared to the prior study, although differences may be somewhat attributable to slice selection. Visualized lung bases are otherwise clear. Hepatobiliary: No focal liver abnormality is seen. No gallstones, gallbladder wall thickening, or biliary dilatation. Pancreas: Unremarkable. No pancreatic ductal dilatation or surrounding inflammatory changes. Spleen: Normal in size without focal abnormality. Adrenals/Urinary Tract: Unremarkable adrenal glands. Kidneys enhance symmetrically without focal lesion, stone, or hydronephrosis. Ureters are nondilated. Urinary bladder appears unremarkable. Stomach/Bowel: Small-bowel obstruction with dilated loops of small bowel measuring up to 3.8 cm in diameter. Transition point within the  central aspect of the lower abdomen anteriorly with fecalized segment (series 3, images 62-63). The distal small bowel and colon are decompressed. Gradual dilation of the upstream small bowel without second transition point to suggest a closed loop obstruction. Surgical anastomotic line within the low pelvis. Vascular/Lymphatic: Scattered aortoiliac atherosclerotic calcifications without aneurysm. No abdominopelvic lymphadenopathy. Reproductive: Status post hysterectomy. No adnexal masses. Other: No free fluid. No abdominopelvic fluid collection. No pneumoperitoneum. No abdominal wall hernia. Musculoskeletal: No acute or significant osseous findings. IMPRESSION: 1. Small-bowel obstruction with transition point within the central aspect of the lower abdomen anteriorly, likely secondary to adhesions given evidence of prior abdominal surgery. 2. 6 mm nodular density within the lingula appears slightly more prominent compared to the prior study, although differences may be somewhat attributable to slice selection. Non-contrast chest CT at 6-12 months is recommended. If the nodule is stable at time of repeat CT, then future CT at 18-24 months (from today's scan) is considered optional for low-risk patients, but is recommended for high-risk patients. This recommendation follows the consensus statement: Guidelines for Management of Incidental Pulmonary Nodules Detected on CT Images: From the Fleischner Society 2017; Radiology 2017; 284:228-243. Aortic Atherosclerosis (ICD10-I70.0). These results were called by telephone at the time of interpretation on 05/13/2020 at 10:53 am to provider [facility] G[facility]and , who verbally acknowledged these results. Electronically Signed   By: Duanne GuessNicholas  Plundo D.O.   On: 05/13/2020 10:53    Procedures Procedures (including critical care time)  Medications Ordered in ED Medications  sodium chloride (PF) 0.9 % injection (  Refused 05/13/20 1026)  diatrizoate meglumine-sodium (GASTROGRAFIN)  66-10 % solution 90 mL (has no administration in time range)  sodium chloride 0.9 % bolus 1,000 mL (0 mLs Intravenous Stopped 05/13/20 1023)  ondansetron (ZOFRAN) injection 4 mg (4 mg Intravenous Given 05/13/20 0841)  morphine 4 MG/ML injection 4 mg (4 mg Intravenous Given 05/13/20 0841)  famotidine (PEPCID) IVPB 20 mg premix (0 mg Intravenous Stopped 05/13/20 1023)  iohexol (OMNIPAQUE) 300 MG/ML solution 100 mL (100 mLs Intravenous  Contrast Given 05/13/20 1026)  ondansetron (ZOFRAN) injection 4 mg (4 mg Intravenous Given 05/13/20 1119)  0.9 %  sodium chloride infusion ( Intravenous New Bag/Given 05/13/20 1123)  morphine 4 MG/ML injection 4 mg (4 mg Intravenous Given 05/13/20 1119)    ED Course  I have reviewed the triage vital signs and the nursing notes.  Pertinent labs & imaging results that were available during my care of the patient were reviewed by me and considered in my medical decision making (see chart for details).    MDM Rules/Calculators/A&P                          Patient presents to the ED with complaints of abdominal pain that began around 9-10 PM last night.  Patient has normal vitals, she appears very uncomfortable, but is not in acute distress. On exam patient tender to palpation primarily in the epigastric and periumbilical region, some distention noted, no peritoneal signs. Will evaluate with labs and CT abdomen pelvis. Analgesics, anti-emetics, and fluids administered.   DDx: Bowel obstruction, perforation, pancreatitis, gastritis, PUD, appendicitis, colitis, diverticulitis, cholecystitis  I have independently ordered, reviewed and interpreted all labs and imaging: CBC: Leukocytosis of 13.5, normal hemoglobin CMP: Glucose of 166 but no other significant electrolyte derangements, normal renal and liver function Lipase: WNL UA: No evidence of infection, no hematuria  Imaging: CT shows small bowel obstruction with transition point in the central aspect of the lower abdomen  anteriorly, likely secondary to adhesions given evidence of previous abdominal surgery.  There is also a 6 mm nodular density in the lingula more prominent when compared to previous studies, recommend close follow-up as an outpatient.  On repeat examination, patient is more comfortable but still experiencing some abdominal pain and distention.  She has not had any further nausea or vomiting here in the emergency department.  Will consult general surgery.  Dr. Alanda Slim with [facility] hospitalist was initially consulted in error.  Case discussed with PA Leary Roca with general surgery who will see and admit the patient, requests NG tube be placed   Final Clinical Impression(s) / ED Diagnoses Final diagnoses:  Small bowel obstruction [facility])    Rx / DC Orders ED Discharge Orders    None       Legrand Rams 05/13/20 1210    Virgina Norfolk, DO 05/13/20 1326

## 2020-05-13 NOTE — ED Triage Notes (Signed)
Pt complains of abd pain since 10 pm last night, she states that she's had some nausea and vomiting last night but none this am, the pain is more periumbilical and she's unsure if she still has her appendix, she states that she had a surgery years ago in [place] but unsure if they took her appendix

## 2020-05-13 NOTE — Progress Notes (Signed)
Called ED for report. Per Diplomatic Services operational officer Nurse is busy will call back for report

## 2020-05-13 NOTE — H&P (Signed)
[name] [birth year]  [id number].    Requesting MD: Jodi Geralds, PA-C (attending, Dr. Lockie Mola) Chief Complaint/Reason for Consult: Abdominal pain, n/v; SBO  HPI: [name] is a 54 y.o. female with a history of HLD who presented to [facility] for abdominal pain, n/v. Patient reports after having dinner yesterday she began having acute onset of severe, constant periumbilical abdominal pain around 9-10 PM. She reports she has had constant nausea and had 3 episodes of emesis with the last being around 8 AM this morning. She is no longer passing flatus. She reports last BM was around 3 PM yesterday and normal, however this was prior to the onset of her symptoms. She denies history of similar symptoms in the past. Patient has a history of prior abdominal hysterectomy as well as ex lap with SBR for SBO ~25 years ago. She has not had a SBO since that time. NGT being placed currently. She is not on any blood thinners. We were asked to see.   ROS: Review of Systems  Constitutional: Negative for chills and fever.  Respiratory: Negative for cough and shortness of breath.   Cardiovascular: Negative for chest pain and leg swelling.  Gastrointestinal: Positive for abdominal pain, nausea and vomiting. Negative for constipation and diarrhea.  Genitourinary: Negative for dysuria.  Musculoskeletal: Negative for falls and myalgias.  Psychiatric/Behavioral: Negative for substance abuse.  All other systems reviewed and are negative.   Family History  Problem Relation Age of Onset  . Cancer Mother        liver cancer   . Cancer Sister     Past Medical History:  Diagnosis Date  . Dizziness 01/2020  . Hyperglycemia   . Ringing in ears, bilateral 01/2020    Past Surgical History:  Procedure Laterality Date  . ABDOMINAL HYSTERECTOMY      Social History:  reports that she has never smoked. She has never used smokeless tobacco. She reports previous alcohol use. She reports that she does not use  drugs. Lives at home with her husband No alcohol use No tobacco use No illicit drug use  Allergies:  Allergies  Allergen Reactions  . Anaprox [Naproxen] Other (See Comments)    "I don't remember"    (Not in a hospital admission)   Physical Exam: Blood pressure 119/68, pulse 89, temperature 98.2 F (36.8 C), temperature source Oral, resp. rate 19, SpO2 100 %. General: pleasant, WD/WN female who is laying in bed in NAD HEENT: head is normocephalic, atraumatic.  Sclera are noninjected.  PERRL.  Ears and nose without any masses or lesions.  Mouth is pink and moist. Dentition fair Heart: regular, rate, and rhythm.  Normal s1,s2. No obvious murmurs, gallops, or rubs noted.  Palpable pedal pulses bilaterally  Lungs: CTAB, no wheezes, rhonchi, or rales noted.  Respiratory effort nonlabored Abd: Soft, moderate distension, epigastric and periumbilical tenderness without rebound, rigidity or guarding. No peritonitis, hypoactive BS, no masses, hernias, or organomegaly. Prior laparotomy scar is well healed  MS: no BUE/BLE edema, calves soft and nontender Skin: warm and dry with no masses, lesions, or rashes Psych: A&Ox4 with an appropriate affect Neuro: cranial nerves grossly intact, equal strength in BUE/BLE bilaterally, normal speech, though process intact   Results for orders placed or performed during the hospital encounter of 05/13/20 (from the past 48 hour(s))  Comprehensive metabolic panel     Status: Abnormal   Collection Time: 05/13/20  9:12 AM  Result Value Ref Range   Sodium 138 135 -  145 mmol/L   Potassium 4.1 3.5 - 5.1 mmol/L   Chloride 104 98 - 111 mmol/L   CO2 22 22 - 32 mmol/L   Glucose, Bld 166 (H) 70 - 99 mg/dL    Comment: Glucose reference range applies only to samples taken after fasting for at least 8 hours.   BUN 16 6 - 20 mg/dL   Creatinine, Ser 7.89 0.44 - 1.00 mg/dL   Calcium 9.3 8.9 - 38.1 mg/dL   Total Protein 7.6 6.5 - 8.1 g/dL   Albumin 4.4 3.5 - 5.0 g/dL     AST 19 15 - 41 U/L   ALT 15 0 - 44 U/L   Alkaline Phosphatase 42 38 - 126 U/L   Total Bilirubin 0.6 0.3 - 1.2 mg/dL   GFR calc non Af Amer >60 >60 mL/min   GFR calc Af Amer >60 >60 mL/min   Anion gap 12 5 - 15    Comment: Performed at [facility], [address], [place], [place] [address]  Lipase, blood     Status: None   Collection Time: 05/13/20  9:12 AM  Result Value Ref Range   Lipase 22 11 - 51 U/L    Comment: Performed at [facility], [address], [place], [place] [address]  CBC with Differential     Status: Abnormal   Collection Time: 05/13/20  9:12 AM  Result Value Ref Range   WBC 13.5 (H) 4.0 - 10.5 K/uL   RBC 5.90 (H) 3.87 - 5.11 MIL/uL   Hemoglobin 13.4 12.0 - 15.0 g/dL   HCT 27.7 36 - 46 %   MCV 73.4 (L) 80.0 - 100.0 fL   MCH 22.7 (L) 26.0 - 34.0 pg   MCHC 30.9 30.0 - 36.0 g/dL   RDW 82.4 23.5 - 36.1 %   Platelets 266 150 - 400 K/uL   nRBC 0.0 0.0 - 0.2 %   Neutrophils Relative % 91 %   Neutro Abs 12.3 (H) 1.7 - 7.7 K/uL   Lymphocytes Relative 5 %   Lymphs Abs 0.7 0.7 - 4.0 K/uL   Monocytes Relative 4 %   Monocytes Absolute 0.5 0 - 1 K/uL   Eosinophils Relative 0 %   Eosinophils Absolute 0.0 0 - 0 K/uL   Basophils Relative 0 %   Basophils Absolute 0.0 0 - 0 K/uL   Immature Granulocytes 0 %   Abs Immature Granulocytes 0.06 0.00 - 0.07 K/uL    Comment: Performed at [facility], [address], [place], [place] [address]  Urinalysis, Routine w reflex microscopic     Status: Abnormal   Collection Time: 05/13/20 10:46 AM  Result Value Ref Range   Color, Urine COLORLESS (A) YELLOW   APPearance CLEAR CLEAR   Specific Gravity, Urine 1.038 (H) 1.005 - 1.030   pH 7.0 5.0 - 8.0   Glucose, UA NEGATIVE NEGATIVE mg/dL   Hgb urine dipstick NEGATIVE NEGATIVE   Bilirubin Urine NEGATIVE NEGATIVE   Ketones, ur NEGATIVE NEGATIVE mg/dL   Protein, ur NEGATIVE NEGATIVE mg/dL   Nitrite NEGATIVE NEGATIVE    Leukocytes,Ua NEGATIVE NEGATIVE    Comment: Performed at [facility] [address], [place], [place] [address]   CT Abdomen Pelvis W Contrast  Result Date: 05/13/2020 CLINICAL DATA:  Periumbilical abdominal pain with nausea and vomiting. History of prior bowel resection. EXAM: CT ABDOMEN AND PELVIS WITH CONTRAST TECHNIQUE: Multidetector CT imaging of the abdomen and pelvis was performed  using the standard protocol following bolus administration of intravenous contrast. CONTRAST:  OMNIPAQUE IOHEXOL 300 MG/ML  SOLN COMPARISON:  11/08/2008 FINDINGS: Lower chest: 6 mm nodular density within the lingula (series 5, image 9) appears slightly more prominent compared to the prior study, although differences may be somewhat attributable to slice selection. Visualized lung bases are otherwise clear. Hepatobiliary: No focal liver abnormality is seen. No gallstones, gallbladder wall thickening, or biliary dilatation. Pancreas: Unremarkable. No pancreatic ductal dilatation or surrounding inflammatory changes. Spleen: Normal in size without focal abnormality. Adrenals/Urinary Tract: Unremarkable adrenal glands. Kidneys enhance symmetrically without focal lesion, stone, or hydronephrosis. Ureters are nondilated. Urinary bladder appears unremarkable. Stomach/Bowel: Small-bowel obstruction with dilated loops of small bowel measuring up to 3.8 cm in diameter. Transition point within the central aspect of the lower abdomen anteriorly with fecalized segment (series 3, images 62-63). The distal small bowel and colon are decompressed. Gradual dilation of the upstream small bowel without second transition point to suggest a closed loop obstruction. Surgical anastomotic line within the low pelvis. Vascular/Lymphatic: Scattered aortoiliac atherosclerotic calcifications without aneurysm. No abdominopelvic lymphadenopathy. Reproductive: Status post hysterectomy. No adnexal masses. Other: No free fluid. No  abdominopelvic fluid collection. No pneumoperitoneum. No abdominal wall hernia. Musculoskeletal: No acute or significant osseous findings. IMPRESSION: 1. Small-bowel obstruction with transition point within the central aspect of the lower abdomen anteriorly, likely secondary to adhesions given evidence of prior abdominal surgery. 2. 6 mm nodular density within the lingula appears slightly more prominent compared to the prior study, although differences may be somewhat attributable to slice selection. Non-contrast chest CT at 6-12 months is recommended. If the nodule is stable at time of repeat CT, then future CT at 18-24 months (from today's scan) is considered optional for low-risk patients, but is recommended for high-risk patients. This recommendation follows the consensus statement: Guidelines for Management of Incidental Pulmonary Nodules Detected on CT Images: From the Fleischner Society 2017; Radiology 2017; 284:228-243. Aortic Atherosclerosis (ICD10-I70.0). These results were called by telephone at the time of interpretation on 05/13/2020 at 10:53 am to provider [facility] , who verbally acknowledged these results. Electronically Signed   By: Duanne Guess D.O.   On: 05/13/2020 10:53   Anti-infectives (From admission, onward)   None       Assessment/Plan 6 mm nodular density within the lingula - will need follow up as outpatient w/ PCP  SBO  - Patient w/ hx of abdominal hysterectomy and ex lap w/ SBR for SBO ~25 years ago - Admit to inpatient - No indication for emergency surgery  - NGT, NPO, SBOP - Keep K > 4 and Mg > 2 for bowel function - Mobilize for bowel function   FEN - NPO, NGT, IVF VTE - SCDs, Lovenox  ID - None   Jacinto Halim, [facility] 05/13/2020, 12:18 PM Please see Amion for pager number during day hours 7:00am-4:30pm

## 2020-05-14 ENCOUNTER — Inpatient Hospital Stay (HOSPITAL_COMMUNITY): Payer: BC Managed Care – PPO

## 2020-05-14 LAB — CBC
HCT: 44.5 % (ref 36.0–46.0)
Hemoglobin: 13.2 g/dL (ref 12.0–15.0)
MCH: 22.7 pg — ABNORMAL LOW (ref 26.0–34.0)
MCHC: 29.7 g/dL — ABNORMAL LOW (ref 30.0–36.0)
MCV: 76.5 fL — ABNORMAL LOW (ref 80.0–100.0)
Platelets: 180 10*3/uL (ref 150–400)
RBC: 5.82 MIL/uL — ABNORMAL HIGH (ref 3.87–5.11)
RDW: 15.7 % — ABNORMAL HIGH (ref 11.5–15.5)
WBC: 6.6 10*3/uL (ref 4.0–10.5)
nRBC: 0 % (ref 0.0–0.2)

## 2020-05-14 LAB — BASIC METABOLIC PANEL
Anion gap: 11 (ref 5–15)
BUN: 11 mg/dL (ref 6–20)
CO2: 25 mmol/L (ref 22–32)
Calcium: 8.6 mg/dL — ABNORMAL LOW (ref 8.9–10.3)
Chloride: 106 mmol/L (ref 98–111)
Creatinine, Ser: 0.59 mg/dL (ref 0.44–1.00)
GFR calc Af Amer: >60 mL/min (ref >60)
GFR calc non Af Amer: >60 mL/min (ref >60)
Glucose, Bld: 130 mg/dL — ABNORMAL HIGH (ref 70–99)
Potassium: 4.3 mmol/L (ref 3.5–5.1)
Sodium: 142 mmol/L (ref 135–145)

## 2020-05-14 LAB — MAGNESIUM: Magnesium: 2.1 mg/dL (ref 1.7–2.4)

## 2020-05-14 LAB — HIV ANTIBODY (ROUTINE TESTING W REFLEX): HIV Screen 4th Generation wRfx: NONREACTIVE

## 2020-05-14 MED ORDER — KETOROLAC TROMETHAMINE 15 MG/ML IJ SOLN
15.0000 mg | Freq: Three times a day (TID) | INTRAMUSCULAR | Status: AC | PRN
  Administered 2020-05-14 – 2020-05-17 (×7): 15 mg via INTRAVENOUS
  Filled 2020-05-14 (×7): qty 1

## 2020-05-14 NOTE — Progress Notes (Signed)
SBO (small bowel obstruction) (HCC)  Subjective: Feels miserable  Objective: Vital signs in last 24 hours: Temp:  [98.1 F (36.7 C)-99.4 F (37.4 C)] 99.4 F (37.4 C) (08/21 0606) Pulse Rate:  [75-99] 99 (08/21 0606) Resp:  [15-24] 18 (08/21 0606) BP: (103-146)/(64-88) 146/68 (08/21 0606) SpO2:  [92 %-100 %] 92 % (08/21 0606) Last BM Date: 05/12/20  Intake/Output from previous day: 08/20 0701 - 08/21 0700 In: 2492.8 [I.V.:1928.2; IV Piggyback:564.6] Out: 1251 [Urine:351; Emesis/NG output:900] Intake/Output this shift: No intake/output data recorded.  General appearance: alert and cooperative GI: normal findings: soft  Lab Results:  Results for orders placed or performed during the hospital encounter of 05/13/20 (from the past 24 hour(s))  Comprehensive metabolic panel     Status: Abnormal   Collection Time: 05/13/20  9:12 AM  Result Value Ref Range   Sodium 138 135 - 145 mmol/L   Potassium 4.1 3.5 - 5.1 mmol/L   Chloride 104 98 - 111 mmol/L   CO2 22 22 - 32 mmol/L   Glucose, Bld 166 (H) 70 - 99 mg/dL   BUN 16 6 - 20 mg/dL   Creatinine, Ser 1.61 0.44 - 1.00 mg/dL   Calcium 9.3 8.9 - 09.6 mg/dL   Total Protein 7.6 6.5 - 8.1 g/dL   Albumin 4.4 3.5 - 5.0 g/dL   AST 19 15 - 41 U/L   ALT 15 0 - 44 U/L   Alkaline Phosphatase 42 38 - 126 U/L   Total Bilirubin 0.6 0.3 - 1.2 mg/dL   GFR calc non Af Amer >60 >60 mL/min   GFR calc Af Amer >60 >60 mL/min   Anion gap 12 5 - 15  Lipase, blood     Status: None   Collection Time: 05/13/20  9:12 AM  Result Value Ref Range   Lipase 22 11 - 51 U/L  CBC with Differential     Status: Abnormal   Collection Time: 05/13/20  9:12 AM  Result Value Ref Range   WBC 13.5 (H) 4.0 - 10.5 K/uL   RBC 5.90 (H) 3.87 - 5.11 MIL/uL   Hemoglobin 13.4 12.0 - 15.0 g/dL   HCT 04.5 36 - 46 %   MCV 73.4 (L) 80.0 - 100.0 fL   MCH 22.7 (L) 26.0 - 34.0 pg   MCHC 30.9 30.0 - 36.0 g/dL   RDW 40.9 81.1 - 91.4 %   Platelets 266 150 - 400 K/uL   nRBC  0.0 0.0 - 0.2 %   Neutrophils Relative % 91 %   Neutro Abs 12.3 (H) 1.7 - 7.7 K/uL   Lymphocytes Relative 5 %   Lymphs Abs 0.7 0.7 - 4.0 K/uL   Monocytes Relative 4 %   Monocytes Absolute 0.5 0 - 1 K/uL   Eosinophils Relative 0 %   Eosinophils Absolute 0.0 0 - 0 K/uL   Basophils Relative 0 %   Basophils Absolute 0.0 0 - 0 K/uL   Immature Granulocytes 0 %   Abs Immature Granulocytes 0.06 0.00 - 0.07 K/uL  Urinalysis, Routine w reflex microscopic     Status: Abnormal   Collection Time: 05/13/20 10:46 AM  Result Value Ref Range   Color, Urine COLORLESS (A) YELLOW   APPearance CLEAR CLEAR   Specific Gravity, Urine 1.038 (H) 1.005 - 1.030   pH 7.0 5.0 - 8.0   Glucose, UA NEGATIVE NEGATIVE mg/dL   Hgb urine dipstick NEGATIVE NEGATIVE   Bilirubin Urine NEGATIVE NEGATIVE   Ketones, ur NEGATIVE NEGATIVE mg/dL  Protein, ur NEGATIVE NEGATIVE mg/dL   Nitrite NEGATIVE NEGATIVE   Leukocytes,Ua NEGATIVE NEGATIVE  SARS Coronavirus 2 by RT PCR (hospital order, performed in [facility] hospital lab) Nasopharyngeal Nasopharyngeal Swab     Status: None   Collection Time: 05/13/20 11:40 AM   Specimen: Nasopharyngeal Swab  Result Value Ref Range   SARS Coronavirus 2 NEGATIVE NEGATIVE  Basic metabolic panel     Status: Abnormal   Collection Time: 05/14/20  5:50 AM  Result Value Ref Range   Sodium 142 135 - 145 mmol/L   Potassium 4.3 3.5 - 5.1 mmol/L   Chloride 106 98 - 111 mmol/L   CO2 25 22 - 32 mmol/L   Glucose, Bld 130 (H) 70 - 99 mg/dL   BUN 11 6 - 20 mg/dL   Creatinine, Ser 2.48 0.44 - 1.00 mg/dL   Calcium 8.6 (L) 8.9 - 10.3 mg/dL   GFR calc non Af Amer >60 >60 mL/min   GFR calc Af Amer >60 >60 mL/min   Anion gap 11 5 - 15  Magnesium     Status: None   Collection Time: 05/14/20  5:50 AM  Result Value Ref Range   Magnesium 2.1 1.7 - 2.4 mg/dL  CBC     Status: Abnormal   Collection Time: 05/14/20  5:50 AM  Result Value Ref Range   WBC 6.6 4.0 - 10.5 K/uL   RBC 5.82 (H) 3.87 - 5.11  MIL/uL   Hemoglobin 13.2 12.0 - 15.0 g/dL   HCT 25.0 36 - 46 %   MCV 76.5 (L) 80.0 - 100.0 fL   MCH 22.7 (L) 26.0 - 34.0 pg   MCHC 29.7 (L) 30.0 - 36.0 g/dL   RDW 03.7 (H) 04.8 - 88.9 %   Platelets 180 150 - 400 K/uL   nRBC 0.0 0.0 - 0.2 %     Studies/Results Radiology     MEDS, Scheduled . bisacodyl  10 mg Rectal Daily  . enoxaparin (LOVENOX) injection  40 mg Subcutaneous Q24H  . lip balm  1 application Topical BID  . pantoprazole (PROTONIX) IV  40 mg Intravenous QHS     Assessment: SBO (small bowel obstruction) (HCC) AXR shows contrast in stomach, NGT high in stomach  Plan: Advance NG 10 cm Ambulate in hall lozenge for sore throat Rpt films in AM   LOS: 1 day    Vanita Panda, MD [facility], [place]    05/14/2020 8:40 AM

## 2020-05-15 ENCOUNTER — Inpatient Hospital Stay (HOSPITAL_COMMUNITY): Payer: BC Managed Care – PPO

## 2020-05-15 MED ORDER — ACETAMINOPHEN 10 MG/ML IV SOLN
1000.0000 mg | Freq: Four times a day (QID) | INTRAVENOUS | Status: AC
  Administered 2020-05-15 – 2020-05-16 (×4): 1000 mg via INTRAVENOUS
  Filled 2020-05-15 (×4): qty 100

## 2020-05-15 NOTE — Progress Notes (Signed)
SBO (small bowel obstruction) (HCC)  Subjective: Looks a bit better, complains mainly of a headache.  Contrast in colon on AXR.  No flatus or BM's yet  Objective: Vital signs in last 24 hours: Temp:  [98.6 F (37 C)-99.3 F (37.4 C)] 99.3 F (37.4 C) (08/22 0321) Pulse Rate:  [87-96] 90 (08/22 0321) Resp:  [15-18] 18 (08/22 0321) BP: (111-130)/(55-66) 130/60 (08/22 0321) SpO2:  [85 %-96 %] 96 % (08/22 0321) Last BM Date: 05/12/20  Intake/Output from previous day: 08/21 0701 - 08/22 0700 In: 2603.9 [I.V.:2603.9] Out: 1625 [Urine:1225; Emesis/NG output:400] Intake/Output this shift: No intake/output data recorded.  General appearance: alert and cooperative GI: normal findings: soft  Lab Results:  No results found for this or any previous visit (from the past 24 hour(s)).   Studies/Results Radiology     MEDS, Scheduled . bisacodyl  10 mg Rectal Daily  . enoxaparin (LOVENOX) injection  40 mg Subcutaneous Q24H  . lip balm  1 application Topical BID  . pantoprazole (PROTONIX) IV  40 mg Intravenous QHS     Assessment: SBO (small bowel obstruction) (HCC) AXR shows contrast in stomach, NGT high in stomach  Plan: Cont NG Ambulate in hall IV tylenol for headache Rpt films in AM   LOS: 2 days    Vanita Panda, MD [facility] Surgery, [place]    05/15/2020 8:41 AM

## 2020-05-16 ENCOUNTER — Inpatient Hospital Stay (HOSPITAL_COMMUNITY): Payer: BC Managed Care – PPO

## 2020-05-16 MED ORDER — ACETAMINOPHEN 650 MG RE SUPP: 650.0000 mg | Freq: Four times a day (QID) | RECTAL | Status: DC | PRN

## 2020-05-16 MED ORDER — ACETAMINOPHEN 10 MG/ML IV SOLN
1000.0000 mg | Freq: Four times a day (QID) | INTRAVENOUS | Status: AC
  Administered 2020-05-16 – 2020-05-17 (×4): 1000 mg via INTRAVENOUS
  Filled 2020-05-16 (×4): qty 100

## 2020-05-16 MED ORDER — ACETAMINOPHEN 325 MG PO TABS
650.0000 mg | ORAL_TABLET | Freq: Four times a day (QID) | ORAL | Status: DC | PRN
  Administered 2020-05-17 – 2020-05-19 (×3): 650 mg via ORAL
  Filled 2020-05-16 (×3): qty 2

## 2020-05-16 NOTE — Progress Notes (Signed)
[facility] Progress Note     Subjective: Patient had a small BM and is passing some flatus. Abdomen feels a little softer but still somewhat bloated. Patient has a headache this AM. Throat is very irritated from NGT.   Objective: Vital signs in last 24 hours: Temp:  [98.2 F (36.8 C)-98.7 F (37.1 C)] 98.2 F (36.8 C) (08/23 1017) Pulse Rate:  [66-74] 70 (08/23 0638) Resp:  [16] 16 (08/23 5102) BP: (127-132)/(69-73) 132/73 (08/23 5852) SpO2:  [96 %-99 %] 96 % (08/23 7782) Last BM Date: 05/12/20  Intake/Output from previous day: 08/22 0701 - 08/23 0700 In: 3634.3 [P.O.:60; I.V.:3173.9; IV Piggyback:400.3] Out: 2750 [Urine:2100; Emesis/NG output:650] Intake/Output this shift: No intake/output data recorded.  PE: General: pleasant, WD, WN female who is laying in bed in discomfort Heart: regular, rate, and rhythm.  Normal s1,s2. No obvious murmurs, gallops, or rubs noted.  Palpable radial and pedal pulses bilaterally Lungs: CTAB, no wheezes, rhonchi, or rales noted.  Respiratory effort nonlabored Abd: soft, NT, ND, +BS, NGT with bilious drainage    Lab Results:  Recent Labs    05/13/20 0912 05/14/20 0550  WBC 13.5* 6.6  HGB 13.4 13.2  HCT 43.3 44.5  PLT 266 180   BMET Recent Labs    05/13/20 0912 05/14/20 0550  NA 138 142  K 4.1 4.3  CL 104 106  CO2 22 25  GLUCOSE 166* 130*  BUN 16 11  CREATININE 0.68 0.59  CALCIUM 9.3 8.6*   PT/INR No results for input(s): LABPROT, INR in the last 72 hours. CMP     Component Value Date/Time   NA 142 05/14/2020 0550   NA 144 01/16/2019 1425   K 4.3 05/14/2020 0550   CL 106 05/14/2020 0550   CO2 25 05/14/2020 0550   GLUCOSE 130 (H) 05/14/2020 0550   BUN 11 05/14/2020 0550   BUN 16 01/16/2019 1425   CREATININE 0.59 05/14/2020 0550   CALCIUM 8.6 (L) 05/14/2020 0550   PROT 7.6 05/13/2020 0912   PROT 7.1 01/16/2019 1425   ALBUMIN 4.4 05/13/2020 0912   ALBUMIN 4.7 01/16/2019 1425   AST 19 05/13/2020  0912   ALT 15 05/13/2020 0912   ALKPHOS 42 05/13/2020 0912   BILITOT 0.6 05/13/2020 0912   BILITOT 0.3 01/16/2019 1425   GFRNONAA >60 05/14/2020 0550   GFRAA >60 05/14/2020 0550   Lipase     Component Value Date/Time   LIPASE 22 05/13/2020 0912       Studies/Results: DG Abd Portable 1V  Result Date: 05/16/2020 CLINICAL DATA:  Small bowel obstruction. EXAM: PORTABLE ABDOMEN - 1 VIEW COMPARISON:  One-view abdomen 05/15/2020 FINDINGS: Decreasing distention of small bowel is present in the left abdomen. Contrast is present throughout the colon. Colon is of normal size. Side port of the NG tube is at the GE junction. IMPRESSION: 1. Improving small bowel obstruction. 2. Side port of the NG tube at the GE junction. This could be advanced for better positioning. Electronically Signed   By: Marin Roberts M.D.   On: 05/16/2020 06:24   DG Abd Portable 1V  Result Date: 05/15/2020 CLINICAL DATA:  54 year old female with history of small-bowel obstruction. EXAM: PORTABLE ABDOMEN - 1 VIEW COMPARISON:  Abdominal radiograph 05/14/2020. FINDINGS: Multiple dilated loops of gas-filled small bowel are again noted in the left side of the abdomen measuring up to 4.7 cm in diameter. Small amount of retained oral contrast material is noted in the colon, as far distally as the  descending colon. Tip of the nasogastric tube is seen projecting over the distal body of the stomach. No gross evidence of pneumoperitoneum on this single supine view. Surgical clips project over the upper pelvis. IMPRESSION: 1. Bowel gas pattern remains compatible with persistent small bowel obstruction, presumably partial given the transit of oral contrast material on this examination and prior studies. Electronically Signed   By: Trudie Reed M.D.   On: 05/15/2020 06:03   DG Abd Portable 1V-Small Bowel Obstruction Protocol-24 hr delay  Result Date: 05/14/2020 CLINICAL DATA:  Small bowel obstruction protocol.  24 hour delay.  EXAM: PORTABLE ABDOMEN - 1 VIEW COMPARISON:  January 13, 2020 FINDINGS: Small bowel loops in the central and left abdomen remain dilated consistent with small-bowel obstruction. Contrast has passed the level of obstruction is now seen in the ascending and transverse colon. IMPRESSION: 1. Contrast has now extended past the level of the small-bowel obstruction, now seen in the ascending and transverse colon. Continued small bowel dilatation in the central left abdomen consistent with persistent obstruction. Electronically Signed   By: Gerome Sam III M.D   On: 05/14/2020 17:04    Anti-infectives: Anti-infectives (From admission, onward)   None       Assessment/Plan 6 mm nodular density within the lingula - will need follow up as outpatient w/ PCP  SBO  - Patient w/ hx of abdominal hysterectomy and ex lap w/ SBR for SBO ~25 years ago - KUB this AM with improving SBO - contrast material throughout colon - removed NGT and starting CLD today - No indication for emergency surgery  - Keep K > 4 and Mg > 2 for bowel function - Mobilize for bowel function    FEN - CLD, IVF VTE - SCDs, Lovenox  ID - None   LOS: 3 days    Juliet Rude , [facility] 05/16/2020, 8:10 AM Please see Amion for pager number during day hours 7:00am-4:30pm

## 2020-05-17 MED ORDER — BISACODYL 10 MG RE SUPP
10.0000 mg | Freq: Once | RECTAL | Status: AC
  Administered 2020-05-17: 10 mg via RECTAL
  Filled 2020-05-17: qty 1

## 2020-05-17 NOTE — Progress Notes (Signed)
   Subjective/Chief Complaint: Pt with some abd dist tol clears Min ambulation   Objective: Vital signs in last 24 hours: Temp:  [98.3 F (36.8 C)-98.9 F (37.2 C)] 98.9 F (37.2 C) (08/24 0537) Pulse Rate:  [55-66] 66 (08/24 0537) Resp:  [18] 18 (08/24 0537) BP: (134-143)/(60-62) 143/62 (08/24 0537) SpO2:  [99 %-100 %] 100 % (08/24 0537) Last BM Date: 05/16/20  Intake/Output from previous day: 08/23 0701 - 08/24 0700 In: 2690.3 [P.O.:120; I.V.:2170.3; IV Piggyback:400] Out: 1800 [Urine:1800] Intake/Output this shift: No intake/output data recorded.  PE:  Constitutional: No acute distress, conversant, appears states age. Eyes: Anicteric sclerae, moist conjunctiva, no lid lag Lungs: Clear to auscultation bilaterally, normal respiratory effort CV: regular rate and rhythm, no murmurs, no peripheral edema, pedal pulses 2+ GI: Soft, no masses or hepatosplenomegaly, non-tender to palpation Skin: No rashes, palpation reveals normal turgor Psychiatric: appropriate judgment and insight, oriented to person, place, and time   Studies/Results: DG Abd Portable 1V  Result Date: 05/16/2020 CLINICAL DATA:  Small bowel obstruction. EXAM: PORTABLE ABDOMEN - 1 VIEW COMPARISON:  One-view abdomen 05/15/2020 FINDINGS: Decreasing distention of small bowel is present in the left abdomen. Contrast is present throughout the colon. Colon is of normal size. Side port of the NG tube is at the GE junction. IMPRESSION: 1. Improving small bowel obstruction. 2. Side port of the NG tube at the GE junction. This could be advanced for better positioning. Electronically Signed   By: Marin Roberts M.D.   On: 05/16/2020 06:24    Anti-infectives: Anti-infectives (From admission, onward)   None      Assessment/Plan: 6 mm nodular density within the lingula- will need follow up as outpatient w/ PCP  SBO  - Patient w/ hx of abdominal hysterectomy and ex lap w/ SBR for SBO ~25 years ago - con't  CLD today - Keep K > 4 and Mg > 2 for bowel function - Mobilize for bowel function  -supp today  FEN -CLD, IVF VTE -SCDs, Lovenox ID -None   LOS: 4 days    Axel Filler 05/17/2020

## 2020-05-18 ENCOUNTER — Inpatient Hospital Stay (HOSPITAL_COMMUNITY): Payer: BC Managed Care – PPO

## 2020-05-18 LAB — BASIC METABOLIC PANEL
Anion gap: 11 (ref 5–15)
BUN: 5 mg/dL — ABNORMAL LOW (ref 6–20)
CO2: 23 mmol/L (ref 22–32)
Calcium: 8.6 mg/dL — ABNORMAL LOW (ref 8.9–10.3)
Chloride: 104 mmol/L (ref 98–111)
Creatinine, Ser: 0.57 mg/dL (ref 0.44–1.00)
GFR calc Af Amer: >60 mL/min (ref >60)
GFR calc non Af Amer: >60 mL/min (ref >60)
Glucose, Bld: 108 mg/dL — ABNORMAL HIGH (ref 70–99)
Potassium: 2.4 mmol/L — CL (ref 3.5–5.1)
Sodium: 138 mmol/L (ref 135–145)

## 2020-05-18 LAB — CBC
HCT: 37.7 % (ref 36.0–46.0)
Hemoglobin: 11.8 g/dL — ABNORMAL LOW (ref 12.0–15.0)
MCH: 22.4 pg — ABNORMAL LOW (ref 26.0–34.0)
MCHC: 31.3 g/dL (ref 30.0–36.0)
MCV: 71.7 fL — ABNORMAL LOW (ref 80.0–100.0)
Platelets: 313 10*3/uL (ref 150–400)
RBC: 5.26 MIL/uL — ABNORMAL HIGH (ref 3.87–5.11)
RDW: 14.6 % (ref 11.5–15.5)
WBC: 9.7 10*3/uL (ref 4.0–10.5)
nRBC: 0 % (ref 0.0–0.2)

## 2020-05-18 MED ORDER — POTASSIUM CHLORIDE CRYS ER 20 MEQ PO TBCR
30.0000 meq | EXTENDED_RELEASE_TABLET | Freq: Two times a day (BID) | ORAL | Status: AC
  Administered 2020-05-18 – 2020-05-19 (×2): 30 meq via ORAL
  Filled 2020-05-18 (×2): qty 1

## 2020-05-18 MED ORDER — POTASSIUM CHLORIDE 10 MEQ/100ML IV SOLN
10.0000 meq | INTRAVENOUS | Status: AC
  Administered 2020-05-18 (×6): 10 meq via INTRAVENOUS
  Filled 2020-05-18 (×2): qty 100

## 2020-05-18 MED ORDER — TRAMADOL HCL 50 MG PO TABS
50.0000 mg | ORAL_TABLET | Freq: Four times a day (QID) | ORAL | Status: DC | PRN
  Filled 2020-05-18: qty 2

## 2020-05-18 MED ORDER — POTASSIUM CHLORIDE CRYS ER 20 MEQ PO TBCR
30.0000 meq | EXTENDED_RELEASE_TABLET | Freq: Two times a day (BID) | ORAL | Status: DC
  Administered 2020-05-18: 30 meq via ORAL
  Filled 2020-05-18: qty 1

## 2020-05-18 NOTE — Progress Notes (Signed)
Lab called in critical lab value 2.4 K+ MD made aware.

## 2020-05-18 NOTE — Progress Notes (Signed)
[facility] Progress Note     Subjective: Patient called sister while I was in the room. She and her sister are upset because they feel like nothing was done for her yesterday and that she is not better. She is having BMs and passing flatus but still feels bloated. She had some increased pain in epigastric abdomen after receiving pain medication as well and got a headache after this. I brought up the possibility of going to the OR if she is truly not getting better and patient seemed shocked by this and refuses this at this time. Sister reports that she goes to [facility] and she feels like maybe they would have some insights.   Objective: Vital signs in last 24 hours: Temp:  [98.7 F (37.1 C)-99 F (37.2 C)] 99 F (37.2 C) (08/25 0605) Pulse Rate:  [60-66] 60 (08/25 0605) Resp:  [16-18] 16 (08/25 0605) BP: (131-151)/(69-77) 131/69 (08/25 0605) SpO2:  [100 %] 100 % (08/25 0605) Last BM Date: 05/17/20  Intake/Output from previous day: 08/24 0701 - 08/25 0700 In: 2932.9 [P.O.:1140; I.V.:1792.9] Out: 2200 [Urine:2200] Intake/Output this shift: Total I/O In: 120 [P.O.:120] Out: -   PE: General: pleasant, WD, WN female who is laying in bed in discomfort Heart: regular, rate, and rhythm.  Normal s1,s2. No obvious murmurs, gallops, or rubs noted.  Palpable radial and pedal pulses bilaterally Lungs: CTAB, no wheezes, rhonchi, or rales noted.  Respiratory effort nonlabored Abd: soft, NT, mildly distended, +BS   Lab Results:  No results for input(s): WBC, HGB, HCT, PLT in the last 72 hours. BMET No results for input(s): NA, K, CL, CO2, GLUCOSE, BUN, CREATININE, CALCIUM in the last 72 hours. PT/INR No results for input(s): LABPROT, INR in the last 72 hours. CMP     Component Value Date/Time   NA 142 05/14/2020 0550   NA 144 01/16/2019 1425   K 4.3 05/14/2020 0550   CL 106 05/14/2020 0550   CO2 25 05/14/2020 0550   GLUCOSE 130 (H) 05/14/2020 0550   BUN 11 05/14/2020  0550   BUN 16 01/16/2019 1425   CREATININE 0.59 05/14/2020 0550   CALCIUM 8.6 (L) 05/14/2020 0550   PROT 7.6 05/13/2020 0912   PROT 7.1 01/16/2019 1425   ALBUMIN 4.4 05/13/2020 0912   ALBUMIN 4.7 01/16/2019 1425   AST 19 05/13/2020 0912   ALT 15 05/13/2020 0912   ALKPHOS 42 05/13/2020 0912   BILITOT 0.6 05/13/2020 0912   BILITOT 0.3 01/16/2019 1425   GFRNONAA >60 05/14/2020 0550   GFRAA >60 05/14/2020 0550   Lipase     Component Value Date/Time   LIPASE 22 05/13/2020 0912       Studies/Results: No results found.  Anti-infectives: Anti-infectives (From admission, onward)   None       Assessment/Plan 6 mm nodular density within the lingula- will need follow up as outpatient w/ PCP  SBO  - Patient w/ hx of abdominal hysterectomy and ex lap w/ SBR for SBO ~25 years ago - NGT removed 8/23 and patient having bowel function - but still feeling bloated - check KUB this AM, keep on clears for now  - No indication for emergency surgery - Keep K > 4 and Mg > 2 for bowel function - Mobilize   FEN -CLD, IVF VTE -SCDs, Lovenox ID -None  LOS: 5 days    Juliet Rude , [facility] 05/18/2020, 9:47 AM Please see Amion for pager number during day hours 7:00am-4:30pm

## 2020-05-19 LAB — BASIC METABOLIC PANEL
Anion gap: 9 (ref 5–15)
BUN: 6 mg/dL (ref 6–20)
CO2: 25 mmol/L (ref 22–32)
Calcium: 9 mg/dL (ref 8.9–10.3)
Chloride: 105 mmol/L (ref 98–111)
Creatinine, Ser: 0.6 mg/dL (ref 0.44–1.00)
GFR calc Af Amer: >60 mL/min (ref >60)
GFR calc non Af Amer: >60 mL/min (ref >60)
Glucose, Bld: 131 mg/dL — ABNORMAL HIGH (ref 70–99)
Potassium: 3.3 mmol/L — ABNORMAL LOW (ref 3.5–5.1)
Sodium: 139 mmol/L (ref 135–145)

## 2020-05-19 LAB — MAGNESIUM: Magnesium: 2.1 mg/dL (ref 1.7–2.4)

## 2020-05-19 MED ORDER — SODIUM CHLORIDE 0.9 % IV SOLN: 250.0000 mL | INTRAVENOUS | Status: DC | PRN

## 2020-05-19 MED ORDER — SODIUM CHLORIDE 0.9% FLUSH: 3.0000 mL | Freq: Two times a day (BID) | INTRAVENOUS | Status: DC

## 2020-05-19 MED ORDER — SODIUM CHLORIDE 0.9% FLUSH: 3.0000 mL | INTRAVENOUS | Status: DC | PRN

## 2020-05-19 NOTE — Progress Notes (Signed)
[facility] Progress Note     Subjective: Patient reports she was having several BMs overnight still and passing gas. She did report some pain and bloating after potato soup yesterday, after further discussion patient reports she does not usually drink milk because it causes her GI discomfort. We discussed that she may be lactose intolerant and that may be what worsened her pain and bloating yesterday. She is agreeable to try soft food this AM and see how she does with this.   Objective: Vital signs in last 24 hours: Temp:  [98.4 F (36.9 C)-99.1 F (37.3 C)] 98.4 F (36.9 C) (08/26 0534) Pulse Rate:  [64-68] 68 (08/26 0534) Resp:  [16] 16 (08/26 0534) BP: (126-131)/(64-72) 128/64 (08/26 0534) SpO2:  [99 %-100 %] 99 % (08/26 0534) Weight:  [68.7 kg] 68.7 kg (08/25 1324) Last BM Date: 05/18/20  Intake/Output from previous day: 08/25 0701 - 08/26 0700 In: 2980 [P.O.:1080; I.V.:900; IV Piggyback:1000] Out: -  Intake/Output this shift: No intake/output data recorded.  PE: General: pleasant, WD, WNfemale who is laying in bed indiscomfort Heart: regular, rate, and rhythm. Normal s1,s2. No obvious murmurs, gallops, or rubs noted. Palpable radial and pedal pulses bilaterally Lungs: CTAB, no wheezes, rhonchi, or rales noted. Respiratory effort nonlabored Abd: soft, NT, mildly distended, +BS   Lab Results:  Recent Labs    05/18/20 1002  WBC 9.7  HGB 11.8*  HCT 37.7  PLT 313   BMET Recent Labs    05/18/20 1002 05/19/20 0514  NA 138 139  K 2.4* 3.3*  CL 104 105  CO2 23 25  GLUCOSE 108* 131*  BUN 5* 6  CREATININE 0.57 0.60  CALCIUM 8.6* 9.0   PT/INR No results for input(s): LABPROT, INR in the last 72 hours. CMP     Component Value Date/Time   NA 139 05/19/2020 0514   NA 144 01/16/2019 1425   K 3.3 (L) 05/19/2020 0514   CL 105 05/19/2020 0514   CO2 25 05/19/2020 0514   GLUCOSE 131 (H) 05/19/2020 0514   BUN 6 05/19/2020 0514   BUN 16 01/16/2019  1425   CREATININE 0.60 05/19/2020 0514   CALCIUM 9.0 05/19/2020 0514   PROT 7.6 05/13/2020 0912   PROT 7.1 01/16/2019 1425   ALBUMIN 4.4 05/13/2020 0912   ALBUMIN 4.7 01/16/2019 1425   AST 19 05/13/2020 0912   ALT 15 05/13/2020 0912   ALKPHOS 42 05/13/2020 0912   BILITOT 0.6 05/13/2020 0912   BILITOT 0.3 01/16/2019 1425   GFRNONAA >60 05/19/2020 0514   GFRAA >60 05/19/2020 0514   Lipase     Component Value Date/Time   LIPASE 22 05/13/2020 0912       Studies/Results: DG Abd Portable 1V  Result Date: 05/18/2020 CLINICAL DATA:  Follow-up small bowel obstruction EXAM: PORTABLE ABDOMEN - 1 VIEW COMPARISON:  05/16/2020 abdominal radiograph FINDINGS: Previously visualized enteric tube is not seen on this radiograph. Surgical clips again noted in the bilateral pelvis. Persistent moderately dilated small bowel loops throughout the central abdomen up to 4.6 cm diameter, stable to slightly worsened. Minimal colonic gas. Previously visualized oral contrast in the colon is absent on today's radiograph. No evidence of pneumatosis or pneumoperitoneum. IMPRESSION: 1. Previously visualized enteric tube is not seen on this radiograph. 2. Persistent moderately dilated small bowel loops throughout the central abdomen, stable to slightly worsened, suggesting persistent mid to distal small bowel obstruction. Electronically Signed   By: Delbert Phenix M.D.   On: 05/18/2020 10:22  Anti-infectives: Anti-infectives (From admission, onward)   None       Assessment/Plan 6 mm nodular density within the lingula- will need follow up as outpatient w/ PCP  SBO  - Patient w/ hx of abdominal hysterectomy and ex lap w/ SBR for SBO ~25 years ago -patient having bowel function on FLD - although had some bloating and pain ?lactose intolerance - advance to soft diet and recheck this afternoon  - No indication for emergency surgery - Keep K > 4 and Mg > 2 for bowel function - Mobilize   FEN -soft diet,  SLIV VTE -SCDs, Lovenox ID -None  LOS: 6 days    Juliet Rude , [facility] 05/19/2020, 9:42 AM Please see Amion for pager number during day hours 7:00am-4:30pm

## 2020-05-19 NOTE — Discharge Summary (Signed)
Physician Discharge Summary  Patient ID: [name] MRN: [id number] DOB/AGE: 25-Oct-1965 54 y.o.  Admit date: 05/13/2020 Discharge date: 05/19/2020  Admission Diagnoses:  SBO Hx endometriosis with abdominal hysterectomy; then exploratory laparotomy with small bowel resection ~25 years ago - [place] Hx endometriosis 6 mm nodular density within the lingula -follow-up per PCP  Discharge Diagnoses:  SBO Hx endometriosis with abdominal hysterectomy; then exploratory laparotomy with small bowel resection ~25 years ago - [place] Hx endometriosis 6 mm nodular density within the lingula -follow-up per PCP  Principal Problem:   SBO (small bowel obstruction) (HCC)   PROCEDURES: None  Hospital Course: [name] is a 54 y.o. female with a history of HLD who presented to [facility] for abdominal pain, n/v. Patient reports after having dinner yesterday she began having acute onset of severe, constant periumbilical abdominal pain around 9-10 PM. She reports she has had constant nausea and had 3 episodes of emesis with the last being around 8 AM this morning. She is no longer passing flatus. She reports last BM was around 3 PM yesterday and normal, however this was prior to the onset of her symptoms. She denies history of similar symptoms in the past. Patient has a history of prior abdominal hysterectomy as well as ex lap with SBR for SBO ~25 years ago. She has not had a SBO since that time. NGT being placed currently. She is not on any blood thinners.  She was seen in the emergency department and admitted with a small bowel obstruction.  She was made n.p.o., NG was placed gastric decompression, she was started on IV fluids for hydration and small bowel protocol.  She had contrast in the on 8/22, but still very distended.  She had a small BM and started passing flatus on 8/23, and advanced to clears liquids. Films showed improvement over the next 2 days, her diet was advanced and she was ready for  discharge by the PM of 05/19/20.  We instructed her to stay on a soft diet for at least 1 week.   CBC Latest Ref Rng & Units 05/18/2020 05/14/2020 05/13/2020  WBC 4.0 - 10.5 K/uL 9.7 6.6 13.5(H)  Hemoglobin 12.0 - 15.0 g/dL 11.8(L) 13.2 13.4  Hematocrit 36 - 46 % 37.7 44.5 43.3  Platelets 150 - 400 K/uL 313 180 266   CMP Latest Ref Rng & Units 05/19/2020 05/18/2020 05/14/2020  Glucose 70 - 99 mg/dL 099(I) 338(S) 505(L)  BUN 6 - 20 mg/dL 6 5(L) 11  Creatinine 9.76 - 1.00 mg/dL 7.34 1.93 7.90  Sodium 135 - 145 mmol/L 139 138 142  Potassium 3.5 - 5.1 mmol/L 3.3(L) 2.4(LL) 4.3  Chloride 98 - 111 mmol/L 105 104 106  CO2 22 - 32 mmol/L 25 23 25   Calcium 8.9 - 10.3 mg/dL 9.0 ) 2.4(O)  Total Protein 6.5 - 8.1 g/dL - - -  Total Bilirubin 0.3 - 1.2 mg/dL - - -  Alkaline Phos 38 - 126 U/L - - -  AST 15 - 41 U/L - - -  ALT 0 - 44 U/L - - -   CT scan 05/13/20:  1. Small-bowel obstruction with transition point within the central aspect of the lower abdomen anteriorly, likely secondary to adhesions given evidence of prior abdominal surgery. 2. 6 mm nodular density within the lingula appears slightly more prominent compared to the prior study, although differences may be somewhat attributable to slice selection. Non-contrast chest CT at 6-12 months is recommended. If the nodule is stable at  time of repeat CT, then future CT at 18-24 months (from today's scan) is considered optional for low-risk patients, but is recommended for high-risk patients. This recommendation follows the consensus statement: Guidelines for Management of Incidental Pulmonary Nodules.  Condition on DC:  Improved    Disposition: Discharge disposition: 01-Home or Self Care        Allergies as of 05/19/2020      Reactions   Anaprox [naproxen] Swelling, Other (See Comments)   swelling and redness      Medication List    You have not been prescribed any medications.     Follow-up Information    Primary care Follow up.    Why: Contact your primary care doctor to follow up medical issues and abnormality found on your lung.                SignedSherrie George 05/19/2020, 2:34 PM

## 2020-05-19 NOTE — Progress Notes (Signed)
D/C instructions given to patient. Patient had no questions. NT or writer will wheel patient out once she dressed

## 2020-05-19 NOTE — Discharge Instructions (Signed)
Bowel Obstruction Stay on a soft (low fiber) diet for the rest of the week A bowel obstruction means that something is blocking the small large bowel. The bowel is also called the intestine. It is the long tube that connects the stomach to the opening of the butt (anus). When something blocks the bowel, food and fluids cannot pass through like normal. This condition needs to be treated. Treatment depends on the cause of the problem and how bad the problem is. What are the causes? Common causes of this condition include:  Scar tissue (adhesions) from past surgery or from high-energy X-rays (radiation).  Recent surgery in the belly. This affects how food moves in the bowel.  Some diseases, such as: ? Irritation of the lining of the digestive tract (Crohn's disease). ? Irritation of small pouches in the bowel (diverticulitis).  Growths or tumors.  A bulging organ (hernia).  Twisting of the bowel (volvulus).  A foreign body.  Slipping of a part of the bowel into another part (intussusception). What are the signs or symptoms? Symptoms of this condition include:  Pain in the belly.  Feeling sick to your stomach (nauseous).  Throwing up (vomiting).  Bloating in the belly.  Being unable to pass gas.  Trouble pooping (constipation).  Watery poop (diarrhea).  A lot of belching. How is this diagnosed? This condition may be diagnosed based on:  A physical exam.  Medical history.  Imaging tests, such as X-ray or CT scan.  Blood tests.  Urine tests. How is this treated? Treatment for this condition may include:  Fluids and pain medicines that are given through an IV tube. Your doctor may tell you not to eat or drink if you feel sick to your stomach and are throwing up.  Eating a clear liquid diet for a few days.  Putting a small tube (nasogastric tube) into the stomach. This will help with pain, discomfort, and nausea by removing blocked air and fluids from the  stomach.  Surgery. This may be needed if other treatments do not work. Follow these instructions at home: Medicines  Take over-the-counter and prescription medicines only as told by your doctor.  If you were prescribed an antibiotic medicine, take it as told by your doctor. Do not stop taking the antibiotic even if you start to feel better. General instructions  Follow your diet as told by your doctor. You may need to: ? Only drink clear liquids until you start to get better. ? Avoid solid foods.  Return to your normal activities as told by your doctor. Ask your doctor what activities are safe for you.  Do not sit for a long time without moving. Get up to take short walks every 1-2 hours. This is important. Ask for help if you feel weak or unsteady.  Keep all follow-up visits as told by your doctor. This is important. How is this prevented? After having a bowel obstruction, you may be more likely to have another. You can do some things to stop it from happening again.  If you have a long-term (chronic) disease, contact your doctor if you see changes or problems.  Take steps to prevent or treat trouble pooping. Your doctor may ask that you: ? Drink enough fluid to keep your pee (urine) pale yellow. ? Take over-the-counter or prescription medicines. ? Eat foods that are high in fiber. These include beans, whole grains, and fresh fruits and vegetables. ? Limit foods that are high in fat and sugar. These include fried  or sweet foods.  Stay active. Ask your doctor which exercises are safe for you.  Avoid stress.  Eat three small meals and three small snacks each day.  Work with a Psychologist, prison and probation servicesfood expert (dietitian) to make a meal plan that works for you.  Do not use any products that contain nicotine or tobacco, such as cigarettes and e-cigarettes. If you need help quitting, ask your doctor. Contact a doctor if:  You have a fever.  You have chills. Get help right away if:  You have pain  or cramps that get worse.  You throw up blood.  You are sick to your stomach.  You cannot stop throwing up.  You cannot drink fluids.  You feel mixed up (confused).  You feel very thirsty (dehydrated).  Your belly gets more bloated.  You feel weak or you pass out (faint). Summary  A bowel obstruction means that something is blocking the small or large bowel.  Treatment may include IV fluids and pain medicine. You may also have a clear liquid diet, a small tube in your stomach, or surgery.  Drink clear liquids and avoid solid foods until you get better. This information is not intended to replace advice given to you by your health care provider. Make sure you discuss any questions you have with your health care provider. Document Revised: 01/22/2018 Document Reviewed: 01/22/2018 Elsevier Patient Education  2020 Elsevier Inc.   Low-Fiber Eating Plan Fiber is found in fruits, vegetables, whole grains, and beans. Eating a diet low in fiber helps to reduce how often you have bowel movements and how much you produce during a bowel movement. A low-fiber eating plan may help your digestive system heal if:  You have certain conditions, such as Crohn's disease or diverticulitis.  You recently had radiation therapy on your pelvis or bowel.  You recently had intestinal surgery.  You have a new surgical opening in your abdomen (colostomy or ileostomy).  Your intestine is narrowed (stricture). Your health care provider will determine how long you need to stay on this diet. Your health care provider may recommend that you work with a diet and nutrition specialist (dietitian). What are tips for following this plan? General guidelines  Follow recommendations from your dietitian about how much fiber you should have each day.  Most people on this eating plan should try to eat less than 10 grams (g) of fiber each day. Your daily fiber goal is _________________ g.  Take vitamin and  mineral supplements as told by your health care provider or dietitian. Chewable or liquid forms are best when on this eating plan. Reading food labels  Check food labels for the amount of dietary fiber.  Choose foods that have less than 2 grams of fiber in one serving. Cooking  Use white flour and other allowed grains for baking and cooking.  Cook meat using methods that keep it tender, such as braising or poaching.  Cook eggs until the yolk is completely solid.  Cook with healthy oils, such as olive oil or canola oil. Meal planning   Eat 5-6 small meals throughout the day instead of 3 large meals.  If you are lactose intolerant: ? Choose low-lactose dairy foods. ? Do not eat dairy foods, if told by your dietitian.  Limit fat and oils to less than 8 teaspoons a day.  Eat small portions of desserts. What foods are allowed? The items listed below may not be a complete list. Talk with your dietitian about what dietary choices  are best for you. Grains All bread and crackers made with white flour. Waffles, pancakes, and [place] toast. Bagels. Pretzels. Melba toast, zwieback, and matzoh. Cooked and dried cereals that do not contain whole grains, added fiber, seeds, or dried fruit. CornmealDenzil Magnuson. Hot and cold cereals made with refined corn, wheat, rice, or oats. Plain pasta and noodles. White rice. Vegetables Well-cooked or canned vegetables without skin, seeds, or stems. Cooked potatoes without skins. Vegetable juice. Fruits Soft-cooked or canned fruits without skin and seeds. Peeled ripe banana. Applesauce. Fruit juice without pulp. Meats and other protein foods Ground meat. Tender cuts of meat or poultry. Eggs. Fish, seafood, and shellfish. Smooth nut butters. Tofu. Dairy All milk products and drinks. Lactose-free milks, including rice, soy, and almond milks. Yogurt without fruit, nuts, chocolate, or granola mix-ins. Sour cream. Cottage cheese. Cheese. Beverages Decaf coffee.  Fruit and vegetable juices or smoothies (in small amounts, with no pulp or skins, and with fruits from allowed list). Sports drinks. Herbal tea. Fats and oils Olive oil, canola oil, sunflower oil, flaxseed oil, and grapeseed oil. Mayonnaise. Cream cheese. Margarine. Butter. Sweets and desserts Plain cakes and cookies. Cream pies and pies made with allowed fruits. Pudding. Custard. Fruit gelatin. Sherbet. Popsicles. Ice cream without nuts. Plain hard candy. Honey. Jelly. Molasses. Syrups, including chocolate syrup. Chocolate. Marshmallows. Gumdrops. Seasoning and other foods Bouillon. Broth. Cream soups made from allowed foods. Strained soup. Casseroles made with allowed foods. Ketchup. Mild mustard. Mild salad dressings. Plain gravies. Vinegar. Spices in moderation. Salt. Sugar. What foods are not allowed? The items listed below may not be a complete list. Talk with your dietitian about what dietary choices are best for you. Grains Whole wheat and whole grain breads and crackers. Multigrain breads and crackers. Rye bread. Whole grain or multigrain cereals. Cereals with nuts, raisins, or coconut. Bran. Coarse wheat cereals. Granola. High-fiber cereals. Cornmeal or corn bread. Whole grain pasta. Wild or brown rice. Quinoa. Popcorn. Buckwheat. Wheat germ. Vegetables Potato skins. Raw or undercooked vegetables. All beans and bean sprouts. Cooked greens. Corn. Peas. Cabbage. Beets. Broccoli. Brussels sprouts. Cauliflower. Mushrooms. Onions. Peppers. Parsnips. Okra. Sauerkraut. Fruit Raw or dried fruit. Berries. Fruit juice with pulp. Prune juice. Meats and other protein foods Tough, fibrous meats with gristle. Fatty meat. Poultry with skin. Fried meat, Environmental education officer, or fish. Deli or lunch meats. Sausage, bacon, and hot dogs. Nuts and chunky nut butter. Dried peas, beans, and lentils. Dairy Yogurt with fruit, nuts, chocolate, or granola mix-ins. Beverages Caffeinated coffee and teas. Fats and  oils Avocado. Coconut. Sweets and desserts Desserts, cookies, or candies that contain nuts or coconut. Dried fruit. Jams and preserves with seeds. Marmalade. Any dessert made with fruits or grains that are not allowed. Seasoning and other foods Corn tortilla chips. Soups made with vegetables or grains that are not allowed. Relish. Horseradish. Rosita Fire. Olives. Summary  Most people on a low-fiber eating plan should eat less than 10 grams of fiber a day. Follow recommendations from your dietitian about how much fiber you should have each day.  Always check food labels to see the dietary fiber content of packaged foods. In general, a low-fiber food will have fewer than 2 grams of fiber per serving.  In general, try to avoid whole grains, raw fruits and vegetables, dried fruit, tough cuts of meat, nuts, and seeds.  Take a vitamin and mineral supplement as told by your health care provider or dietitian. This information is not intended to replace advice given to you by your  health care provider. Make sure you discuss any questions you have with your health care provider. Document Revised: 01/02/2019 Document Reviewed: 11/13/2016 Elsevier Patient Education  2020 [employer].

## 2020-05-27 ENCOUNTER — Encounter: Payer: Self-pay | Admitting: Family Medicine

## 2020-05-27 ENCOUNTER — Telehealth (INDEPENDENT_AMBULATORY_CARE_PROVIDER_SITE_OTHER): Payer: BC Managed Care – PPO | Admitting: Family Medicine

## 2020-05-27 DIAGNOSIS — R110 Nausea: Secondary | ICD-10-CM | POA: Diagnosis not present

## 2020-05-27 DIAGNOSIS — Z09 Encounter for follow-up examination after completed treatment for conditions other than malignant neoplasm: Secondary | ICD-10-CM

## 2020-05-27 DIAGNOSIS — K56609 Unspecified intestinal obstruction, unspecified as to partial versus complete obstruction: Secondary | ICD-10-CM

## 2020-05-27 NOTE — Progress Notes (Signed)
Virtual Visit via Telephone Note  I connected with [name] on 05/27/20 at  3:20 PM EDT by telephone and verified that I am speaking with the correct person using two identifiers.   I discussed the limitations, risks, security and privacy concerns of performing an evaluation and management service by telephone and the availability of in person appointments. I also discussed with the patient that there may be a patient responsible charge related to this service. The patient expressed understanding and agreed to proceed.  Televisit Today Patient Location: Home Provider Location: Office   History of Present Illness: Past Surgical History:  Procedure Laterality Date  . ABDOMINAL HYSTERECTOMY  1995   endometriosis  . APPENDECTOMY  1995   at time of TAH for endometriosis  . SMALL INTESTINE SURGERY  1998   ex lap, SB resection for SBO    Social History   Socioeconomic History  . Marital status: Married    Spouse name: Not on file  . Number of children: Not on file  . Years of education: Not on file  . Highest education level: Not on file  Occupational History  . Not on file  Tobacco Use  . Smoking status: Never Smoker  . Smokeless tobacco: Never Used  Vaping Use  . Vaping Use: Never used  Substance and Sexual Activity  . Alcohol use: Not Currently  . Drug use: Never  . Sexual activity: Yes  Other Topics Concern  . Not on file  Social History Narrative  . Not on file   Social Determinants of Health   Financial Resource Strain:   . Difficulty of Paying Living Expenses: Not on file  Food Insecurity:   . Worried About Programme researcher, broadcasting/film/video in the Last Year: Not on file  . Ran Out of Food in the Last Year: Not on file  Transportation Needs:   . Lack of Transportation (Medical): Not on file  . Lack of Transportation (Non-Medical): Not on file  Physical Activity:   . Days of Exercise per Week: Not on file  . Minutes of Exercise per Session: Not on file  Stress:   .  Feeling of Stress : Not on file  Social Connections:   . Frequency of Communication with Friends and Family: Not on file  . Frequency of Social Gatherings with Friends and Family: Not on file  . Attends Religious Services: Not on file  . Active Member of Clubs or Organizations: Not on file  . Attends Banker Meetings: Not on file  . Marital Status: Not on file  Intimate Partner Violence:   . Fear of Current or Ex-Partner: Not on file  . Emotionally Abused: Not on file  . Physically Abused: Not on file  . Sexually Abused: Not on file    Family History  Problem Relation Age of Onset  . Cancer Mother        liver cancer   . Cancer Sister    Past Medical History:  Diagnosis Date  . Dizziness 01/2020  . Endometriosis 1990  . Hyperglycemia   . Jejunitis   . Ringing in ears, bilateral 01/2020  . Small bowel obstruction (HCC) 05/13/2020    No current outpatient medications on file prior to visit.   No current facility-administered medications on file prior to visit.    Allergies  Allergen Reactions  . Anaprox [Naproxen] Swelling and Other (See Comments)    swelling and redness    Current Status: Since her last office visit, she  has had a Hospital Admission from 05/13/2020-05/19/2020 for Small Bowel Obstruction. is doing well with no complaints. Her sister is on the phone call with her today. She has follow up with [facility] for follow up for SBO. She is currently on soft and liquid diet since her discharge. She denies GI problems such as nausea, vomiting, diarrhea, and constipation. She has no reports of blood in stools, dysuria and hematuria. She denies fevers, chills, fatigue, recent infections, weight loss, and night sweats. She has not had any headaches, visual changes, dizziness, and falls. No chest pain, heart palpitations, cough and shortness of breath reported. No depression or anxiety, and denies suicidal ideations, homicidal ideations, or auditory  hallucinations. She is taking all medications as prescribed. She denies pain today.    Observations/Objective:  Telephone Visit   Assessment and Plan:  1. Hospital discharge follow-up  2. Small bowel obstruction (HCC) Resolved. No signs or symptoms of recurrence noted today. - Comprehensive metabolic panel; Future - CBC with Differential; Future - TSH; Future - Lipid Panel; Future - Magnesium; Future - Vitamin D, 25-hydroxy; Future - Vitamin B12; Future - CT Chest Wo Contrast; Future  3. SBO (small bowel obstruction) (HCC)  4. Nausea Resolved.   5. Follow up She will follow up in 2 months.  No orders of the defined types were placed in this encounter.   Orders Placed This Encounter  Procedures  . CT Chest Wo Contrast  . Comprehensive metabolic panel  . CBC with Differential  . TSH  . Lipid Panel  . Magnesium  . Vitamin D, 25-hydroxy  . Vitamin B12    Referral Orders  No referral(s) requested today    Raliegh Ip,  MSN, FNP-BC [facility] [address] [place], [place] [address] [phone]    I discussed the assessment and treatment plan with the patient. The patient was provided an opportunity to ask questions and all were answered. The patient agreed with the plan and demonstrated an understanding of the instructions.   The patient was advised to call back or seek an in-person evaluation if the symptoms worsen or if the condition fails to improve as anticipated.  I provided 40 minutes of non-face-to-face time during this encounter.   Kallie Locks, FNP

## 2020-05-28 DIAGNOSIS — R739 Hyperglycemia, unspecified: Secondary | ICD-10-CM | POA: Diagnosis not present

## 2020-05-28 DIAGNOSIS — E876 Hypokalemia: Secondary | ICD-10-CM | POA: Diagnosis not present

## 2020-05-28 DIAGNOSIS — R5381 Other malaise: Secondary | ICD-10-CM | POA: Diagnosis not present

## 2020-05-30 ENCOUNTER — Encounter: Payer: Self-pay | Admitting: Family Medicine

## 2020-06-22 ENCOUNTER — Other Ambulatory Visit: Payer: Self-pay | Admitting: Family Medicine

## 2020-06-22 ENCOUNTER — Other Ambulatory Visit: Payer: Self-pay | Admitting: *Deleted

## 2020-06-22 DIAGNOSIS — Z1231 Encounter for screening mammogram for malignant neoplasm of breast: Secondary | ICD-10-CM

## 2020-06-23 ENCOUNTER — Other Ambulatory Visit: Payer: Self-pay

## 2020-06-23 ENCOUNTER — Ambulatory Visit
Admission: RE | Admit: 2020-06-23 | Discharge: 2020-06-23 | Disposition: A | Discharge status: Home / Self Care | Payer: BC Managed Care – PPO | Source: Ambulatory Visit | Attending: Family Medicine | Admitting: Family Medicine

## 2020-06-23 DIAGNOSIS — Z1231 Encounter for screening mammogram for malignant neoplasm of breast: Secondary | ICD-10-CM

## 2020-06-27 LAB — HM MAMMOGRAPHY

## 2020-06-28 NOTE — Progress Notes (Signed)
Pt was called to discuss her lab results with the translator on the line. A message was left to gives [place] a call back.

## 2020-06-30 ENCOUNTER — Telehealth: Payer: Self-pay

## 2020-07-13 ENCOUNTER — Encounter: Payer: Self-pay | Admitting: Internal Medicine

## 2020-07-13 ENCOUNTER — Other Ambulatory Visit: Payer: Self-pay

## 2020-07-13 ENCOUNTER — Ambulatory Visit (INDEPENDENT_AMBULATORY_CARE_PROVIDER_SITE_OTHER): Payer: BC Managed Care – PPO | Admitting: Internal Medicine

## 2020-07-13 ENCOUNTER — Telehealth: Payer: Self-pay | Admitting: Internal Medicine

## 2020-07-13 VITALS — BP 110/70 | HR 72 | Temp 98.0°F | Ht 64.0 in | Wt 135.1 lb

## 2020-07-13 DIAGNOSIS — E559 Vitamin D deficiency, unspecified: Secondary | ICD-10-CM

## 2020-07-13 DIAGNOSIS — Z124 Encounter for screening for malignant neoplasm of cervix: Secondary | ICD-10-CM

## 2020-07-13 DIAGNOSIS — K56609 Unspecified intestinal obstruction, unspecified as to partial versus complete obstruction: Secondary | ICD-10-CM | POA: Diagnosis not present

## 2020-07-13 DIAGNOSIS — R911 Solitary pulmonary nodule: Secondary | ICD-10-CM | POA: Diagnosis not present

## 2020-07-13 DIAGNOSIS — G8929 Other chronic pain: Secondary | ICD-10-CM

## 2020-07-13 DIAGNOSIS — Z1211 Encounter for screening for malignant neoplasm of colon: Secondary | ICD-10-CM

## 2020-07-13 DIAGNOSIS — Z23 Encounter for immunization: Secondary | ICD-10-CM | POA: Diagnosis not present

## 2020-07-13 DIAGNOSIS — M545 Low back pain, unspecified: Secondary | ICD-10-CM

## 2020-07-13 NOTE — Patient Instructions (Signed)
-  Nice seeing you today!!  -Tdap and first shingles vaccines today.  -Schedule follow up in 6 months for your physical. Please come in fasting that day.

## 2020-07-13 NOTE — Telephone Encounter (Signed)
Pt request VITAMIN D labs after 7 weeks when she takes her last weekly dose of vitamin D medication. Please place order for December 2021 for lab. Pt states provider to order CT scan in February 2022. Please place future order if appropriate.

## 2020-07-13 NOTE — Telephone Encounter (Signed)
Vitamin D ordered

## 2020-07-13 NOTE — Progress Notes (Signed)
New Patient Office Visit     This visit occurred during the SARS-CoV-2 public health emergency.  Safety protocols were in place, including screening questions prior to the visit, additional usage of staff PPE, and extensive cleaning of exam room while observing appropriate contact time as indicated for disinfecting solutions.    CC/Reason for Visit: Establish care, discuss chronic conditions Previous PCP: Unknown Last Visit: Unknown  HPI: [name] is a 54 y.o. female who is coming in today for the above mentioned reasons. Past Medical History is significant for: Endometriosis status post hysterectomy and bilateral salpingo-oophorectomy 30 years ago per report.  She was hospitalized in August with small bowel obstruction thought due to adhesions which resolved with conservative measures.  During that hospitalization she was found to have an incidental 6 mm lingular lobe nodule and was advised CT follow-up in 6 months.  She has also been diagnosed recently with vitamin D deficiency and is currently on week 6 of high-dose supplementation.  She had a normal mammogram in September.  She is overdue for screening colonoscopy.  She is overdue for flu, shingles, Tdap vaccines.  She has some chronic right back pain and has the name of an orthopedist that she would like a referral to.   Past Medical/Surgical History: Past Medical History:  Diagnosis Date  . Dizziness 01/2020  . Endometriosis 1990  . Hyperglycemia   . Jejunitis   . Ringing in ears, bilateral 01/2020  . Small bowel obstruction (HCC) 05/13/2020    Past Surgical History:  Procedure Laterality Date  . ABDOMINAL HYSTERECTOMY  1995   endometriosis  . APPENDECTOMY  1995   at time of TAH for endometriosis  . SMALL INTESTINE SURGERY  1998   ex lap, SB resection for SBO    Social History:  reports that she has never smoked. She has never used smokeless tobacco. She reports previous alcohol use. She reports that she does not  use drugs.  Allergies: Allergies  Allergen Reactions  . Anaprox [Naproxen] Swelling and Other (See Comments)    swelling and redness    Family History:  Family History  Problem Relation Age of Onset  . Cancer Mother        liver cancer   . Cancer Sister      Current Outpatient Medications:  Marland Kitchen  Vitamin D, Ergocalciferol, (DRISDOL) 1.25 MG (50000 UNIT) CAPS capsule, Take 50,000 Units by mouth every 7 (seven) days., Disp: , Rfl:   Review of Systems:  Constitutional: Denies fever, chills, diaphoresis, appetite change and fatigue.  HEENT: Denies photophobia, eye pain, redness, hearing loss, ear pain, congestion, sore throat, rhinorrhea, sneezing, mouth sores, trouble swallowing, neck pain, neck stiffness and tinnitus.   Respiratory: Denies SOB, DOE, cough, chest tightness,  and wheezing.   Cardiovascular: Denies chest pain, palpitations and leg swelling.  Gastrointestinal: Denies nausea, vomiting, abdominal pain, diarrhea, constipation, blood in stool and abdominal distention.  Genitourinary: Denies dysuria, urgency, frequency, hematuria, flank pain and difficulty urinating.  Endocrine: Denies: hot or cold intolerance, sweats, changes in hair or nails, polyuria, polydipsia. Musculoskeletal: Denies myalgias, back pain, joint swelling, arthralgias and gait problem.  Skin: Denies pallor, rash and wound.  Neurological: Denies dizziness, seizures, syncope, weakness, light-headedness, numbness and headaches.  Hematological: Denies adenopathy. Easy bruising, personal or family bleeding history  Psychiatric/Behavioral: Denies suicidal ideation, mood changes, confusion, nervousness, sleep disturbance and agitation    Physical Exam: Vitals:   07/13/20 1349  BP: 110/70  Pulse: 72  Temp: 98 F (  36.7 C)  TempSrc: Oral  SpO2: 99%  Weight: 135 lb 1.6 oz (61.3 kg)  Height: 5\' 4"  (1.626 m)   Body mass index is 23.19 kg/m.  Constitutional: NAD, calm, comfortable Eyes: PERRL, lids and  conjunctivae normal ENMT: Mucous membranes are moist. Respiratory: clear to auscultation bilaterally, no wheezing, no crackles. Normal respiratory effort. No accessory muscle use.  Cardiovascular: Regular rate and rhythm, no murmurs / rubs / gallops. No extremity edema. Abdomen: no tenderness, no masses palpated. No hepatosplenomegaly. Bowel sounds positive.  Neurologic: Grossly intact and nonfocal Psychiatric: Normal judgment and insight. Alert and oriented x 3. Normal mood.    Impression and Plan:  Solitary pulmonary nodule -She will need repeat CT scan in 6 months which is in February 2021.  SBO (small bowel obstruction) (HCC) -Status post hospitalization in August, did not require ex lap.  Vitamin D deficiency -She will schedule follow-up vitamin D level once she has completed 12 weeks of high-dose supplementation.  Need for Tdap vaccination -Tdap vaccine administered today.  Need for shingles vaccine -First shingles vaccine administered today.  Colon cancer screening  - Plan: Ambulatory referral to Gastroenterology  Cervical cancer screening  - Plan: Ambulatory referral to Gynecology  Chronic low back pain without sciatica, unspecified back pain laterality  - Plan: Ambulatory referral to Orthopedic Surgery per patient request.    Patient Instructions  -Nice seeing you today!!  -Tdap and first shingles vaccines today.  -Schedule follow up in 6 months for your physical. Please come in fasting that day.     September, MD [facility]

## 2020-07-13 NOTE — Addendum Note (Signed)
Addended by: Kern Reap B on: 07/13/2020 05:30 PM   Modules accepted: Orders

## 2020-08-05 ENCOUNTER — Ambulatory Visit: Payer: No Typology Code available for payment source | Admitting: Family Medicine

## 2020-08-09 DIAGNOSIS — N898 Other specified noninflammatory disorders of vagina: Secondary | ICD-10-CM | POA: Diagnosis not present

## 2020-08-09 DIAGNOSIS — Z124 Encounter for screening for malignant neoplasm of cervix: Secondary | ICD-10-CM | POA: Diagnosis not present

## 2020-08-09 DIAGNOSIS — Z1211 Encounter for screening for malignant neoplasm of colon: Secondary | ICD-10-CM | POA: Diagnosis not present

## 2020-08-09 DIAGNOSIS — Z01419 Encounter for gynecological examination (general) (routine) without abnormal findings: Secondary | ICD-10-CM | POA: Diagnosis not present

## 2020-08-09 DIAGNOSIS — Z6820 Body mass index (BMI) 20.0-20.9, adult: Secondary | ICD-10-CM | POA: Diagnosis not present

## 2020-08-17 LAB — HM PAP SMEAR: HPV, high-risk: NEGATIVE

## 2020-08-26 DIAGNOSIS — M545 Low back pain, unspecified: Secondary | ICD-10-CM | POA: Diagnosis not present

## 2020-08-26 DIAGNOSIS — M47814 Spondylosis without myelopathy or radiculopathy, thoracic region: Secondary | ICD-10-CM | POA: Diagnosis not present

## 2020-08-26 DIAGNOSIS — M549 Dorsalgia, unspecified: Secondary | ICD-10-CM | POA: Diagnosis not present

## 2020-08-30 ENCOUNTER — Ambulatory Visit: Payer: BC Managed Care – PPO | Admitting: Gastroenterology

## 2020-09-07 ENCOUNTER — Other Ambulatory Visit: Payer: BC Managed Care – PPO

## 2020-09-07 ENCOUNTER — Other Ambulatory Visit: Payer: Self-pay

## 2020-09-07 DIAGNOSIS — E559 Vitamin D deficiency, unspecified: Secondary | ICD-10-CM

## 2020-09-07 DIAGNOSIS — G8929 Other chronic pain: Secondary | ICD-10-CM | POA: Diagnosis not present

## 2020-09-07 DIAGNOSIS — M47814 Spondylosis without myelopathy or radiculopathy, thoracic region: Secondary | ICD-10-CM | POA: Diagnosis not present

## 2020-09-07 DIAGNOSIS — R2689 Other abnormalities of gait and mobility: Secondary | ICD-10-CM | POA: Diagnosis not present

## 2020-09-08 LAB — VITAMIN D 25 HYDROXY (VIT D DEFICIENCY, FRACTURES): Vit D, 25-Hydroxy: 68 ng/mL (ref 30–100)

## 2020-09-13 DIAGNOSIS — M47814 Spondylosis without myelopathy or radiculopathy, thoracic region: Secondary | ICD-10-CM | POA: Diagnosis not present

## 2020-09-13 DIAGNOSIS — G8929 Other chronic pain: Secondary | ICD-10-CM | POA: Diagnosis not present

## 2020-09-13 DIAGNOSIS — R2689 Other abnormalities of gait and mobility: Secondary | ICD-10-CM | POA: Diagnosis not present

## 2020-09-19 ENCOUNTER — Encounter: Payer: Self-pay | Admitting: Nurse Practitioner

## 2020-09-19 ENCOUNTER — Ambulatory Visit (INDEPENDENT_AMBULATORY_CARE_PROVIDER_SITE_OTHER): Payer: BC Managed Care – PPO | Admitting: Nurse Practitioner

## 2020-09-19 VITALS — BP 110/70 | HR 60 | Ht 64.0 in | Wt 136.6 lb

## 2020-09-19 DIAGNOSIS — Z1211 Encounter for screening for malignant neoplasm of colon: Secondary | ICD-10-CM | POA: Diagnosis not present

## 2020-09-19 MED ORDER — PLENVU 140 G PO SOLR: 1.0000 | ORAL | 0 refills | Status: DC

## 2020-09-19 NOTE — Patient Instructions (Signed)
If you are age 54 or older, your body mass index should be between 23-30. Your Body mass index is 23.45 kg/m. If this is out of the aforementioned range listed, please consider follow up with your Primary Care Provider.  If you are age 37 or younger, your body mass index should be between 19-25. Your Body mass index is 23.45 kg/m. If this is out of the aformentioned range listed, please consider follow up with your Primary Care Provider.    You have been scheduled for a colonoscopy. Please follow written instructions given to you at your visit today.  Please pick up your prep supplies at the pharmacy within the next 1-3 days. If you use inhalers (even only as needed), please bring them with you on the day of your procedure.    It was great seeing you today!  Thank you for entrusting me with your care and choosing [facility].  Alcide Evener, NP

## 2020-09-19 NOTE — Progress Notes (Signed)
Addendum: Reviewed and agree with assessment and management plan. Zykee Avakian M, MD  

## 2020-09-19 NOTE — Progress Notes (Addendum)
09/19/2020 [name] [id number] 09-Mar-1966   CHIEF COMPLAINT: Schedule a colonoscopy   HISTORY OF PRESENT ILLNESS:  [name] is a 57 year the past medical history of a small bowel obstruction s/p resection 28 years ago and a small bowel obstruction 04/2020 which resolved without surgical intervention.  History of endometriosis status post hysterectomy and appendectomy 1995. She presents her office today as referred by Dr. Philip Aspen to schedule a screening colonoscopy.  She reports having diarrhea once monthly without specific food triggers.  No bloody diarrhea.  She typically passes a normal formed brown bowel movement daily.  No rectal bleeding or melena.  She underwent a colonoscopy 20+ years ago which she stated was incomplete possibly due from adhesions from her prior bowel resection 20 years ago.  She does not recall why she had the colonoscopy done. At the time of her laparoscopic hysterectomy, adhesions were possibly removed.  Further surgical details are unclear. She denies any family history of colon polyps or colorectal cancer.  Reports losing about 12 pounds since her hospitalization for bowel obstruction 04/2020.  She reports eating smaller quantities of food since that time.  No fever, sweats or chills.  He speaks [place] fairly well, however, she presents with her friend [name] to interpret which facilitated communication during this visit.  No other complaints at this time.  CBC Latest Ref Rng & Units 05/18/2020 05/14/2020 05/13/2020  WBC 4.0 - 10.5 K/uL 9.7 6.6 13.5(H)  Hemoglobin 12.0 - 15.0 g/dL 11.8(L) 13.2 13.4  Hematocrit 36.0 - 46.0 % 37.7 44.5 43.3  Platelets 150 - 400 K/uL 313 180 266   CMP Latest Ref Rng & Units 05/19/2020 05/18/2020 05/14/2020  Glucose 70 - 99 mg/dL 782(N) 562(Z) 308(M)  BUN 6 - 20 mg/dL 6 5(L) 11  Creatinine 5.78 - 1.00 mg/dL 4.69 6.29 5.28  Sodium 135 - 145 mmol/L 139 138 142  Potassium 3.5 - 5.1 mmol/L 3.3(L) 2.4(LL) 4.3  Chloride 98 - 111  mmol/L 105 104 106  CO2 22 - 32 mmol/L 25 23 25   Calcium 8.9 - 10.3 mg/dL 9.0 ) 4.1(L)  Total Protein 6.5 - 8.1 g/dL - - -  Total Bilirubin 0.3 - 1.2 mg/dL - - -  Alkaline Phos 38 - 126 U/L - - -  AST 15 - 41 U/L - - -  ALT 0 - 44 U/L - - -    CTAP with contrast 05/13/2020: 1. Small-bowel obstruction with transition point within the central aspect of the lower abdomen anteriorly, likely secondary to adhesions given evidence of prior abdominal surgery. 2. 6 mm nodular density within the lingula appears slightly more prominent compared to the prior study, although differences may be somewhat attributable to slice selection. Non-contrast chest CT at 6-12 months is recommended. If the nodule is stable at time of repeat CT, then future CT at 18-24 months (from today's scan) is considered optional for low-risk patients, but is recommended for high-risk patients.  Aortic Atherosclerosis   Past Medical History:  Diagnosis Date  . Dizziness 01/2020  . Endometriosis 1990  . Hyperglycemia   . Jejunitis   . Ringing in ears, bilateral 01/2020  . Small bowel obstruction (HCC) 05/13/2020   Past Surgical History:  Procedure Laterality Date  . ABDOMINAL HYSTERECTOMY  1995   endometriosis  . APPENDECTOMY  1995   at time of TAH for endometriosis  . SMALL INTESTINE SURGERY  1998   ex lap, SB resection for SBO   Social History: She  is married.  She is employed in [employer] as a second language teacher.  Nonsmoker. No alcohol use. No drug use.   Family History: Father 20 with HTN and DM. Mother died age 52 liver cancer, MI. Sister x 2 fibromyalgia,  kidney disease and chronic pain.  Brother with heart problems. No family history of IBD, esophageal, gastric or colon cancer.   Allergies  Allergen Reactions  . Anaprox [Naproxen] Swelling and Other (See Comments)    swelling and redness      Outpatient Encounter Medications as of 09/19/2020  Medication Sig  . [DISCONTINUED] Vitamin D, Ergocalciferol,  (DRISDOL) 1.25 MG (50000 UNIT) CAPS capsule Take 50,000 Units by mouth every 7 (seven) days.   No facility-administered encounter medications on file as of 09/19/2020.    REVIEW OF SYSTEMS:  Gen: Denies fever, sweats or chills. No weight loss.  CV: Denies chest pain, palpitations or edema. Resp: Denies cough, shortness of breath of hemoptysis.  GI: No GERD symptoms.  See HPI. GU : Denies urinary burning, blood in urine, increased urinary frequency or incontinence. MS: Denies joint pain, muscles aches or weakness. Derm: Denies rash, itchiness, skin lesions or unhealing ulcers. Psych: Denies depression, anxiety or memory loss. Heme: Denies bruising, bleeding. Neuro:  Denies headaches, dizziness or paresthesias. Endo:  Denies any problems with DM, thyroid or adrenal function.   PHYSICAL EXAM: BP 110/70   Pulse 60   Ht 5\' 4"  (1.626 m)   Wt 136 lb 9.6 oz (62 kg)   BMI 23.45 kg/m  General: Well developed  54 year old female in no acute distress. Head: Normocephalic and atraumatic. Eyes:  Sclerae non-icteric, conjunctive pink. Ears: Normal auditory acuity. Mouth: Dentition intact. No ulcers or lesions.  Neck: Supple, no lymphadenopathy or thyromegaly.  Lungs: Clear bilaterally to auscultation without wheezes, crackles or rhonchi. Heart: Regular rate and rhythm. No murmur, rub or gallop appreciated.  Abdomen: Soft, nontender, non distended. No masses. No hepatosplenomegaly. Normoactive bowel sounds x 4 quadrants.  Upper abdominal orta palpable without bruit.  Thick lower abdominal midline scar intact. Rectal: Deferred. Musculoskeletal: Symmetrical with no gross deformities. Skin: Warm and dry. No rash or lesions on visible extremities. Extremities: No edema. Neurological: Alert oriented x 4, no focal deficits.  Psychological:  Alert and cooperative. Normal mood and affect.  ASSESSMENT AND PLAN:  75.  54 year old female presents to schedule screening colonoscopy.  Patient possibly  underwent an incomplete colonoscopy prior to her hysterectomy surgery, possible lysis of adhesions during this surgery. Further details are unclear.  No family history of colon cancer. -Colonoscopy benefits and risks discussed including risk with sedation, risk of bleeding, perforation and infection  -Further follow-up to be determined after colonoscopy completed  2.  History of small bowel obstruction status post resection 28 years ago, small bowel resection 04/2020 resolved without surgical intervention  3.  Diarrhea once monthly -Patient to monitor her diarrhea episodes, assess for specific food triggers -Patient to call our office if her diarrhea worsens  4. 77mm pulmonary nodule per CTAP 04/2020 -Repeat CT in 6 months per PCP  Addendum: records from her gyn receive, repeat labs not done recently. Labs 04/2020 showed a mild anemia with Hg 11.8 and hypokalemia with K+ 3.3 during her hospitalization with SBO.  CBC and BMP ordered. See phone note.   CC:  05/2020, Est[place]*

## 2020-09-20 ENCOUNTER — Telehealth: Payer: Self-pay | Admitting: Nurse Practitioner

## 2020-09-20 NOTE — Telephone Encounter (Signed)
Beth, pls contact patient and let her know I received her records from her gyn and I did not see any repeat labs done. Pls enter a lab order for a CBC as her last CBC and BMP at the time of her hospital admission 04/2020 showed a mild anemia with Hg 11.8 and low potassium level.  Note, patient speaks English fairly well, if needed have spanish speaking staff contact her. CBC and BMP can be done at her convenience within the next week. Thank you.

## 2020-09-21 DIAGNOSIS — R2689 Other abnormalities of gait and mobility: Secondary | ICD-10-CM | POA: Diagnosis not present

## 2020-09-21 DIAGNOSIS — G8929 Other chronic pain: Secondary | ICD-10-CM | POA: Diagnosis not present

## 2020-09-21 DIAGNOSIS — M47814 Spondylosis without myelopathy or radiculopathy, thoracic region: Secondary | ICD-10-CM | POA: Diagnosis not present

## 2020-09-22 ENCOUNTER — Other Ambulatory Visit: Payer: Self-pay | Admitting: Nurse Practitioner

## 2020-09-22 DIAGNOSIS — D649 Anemia, unspecified: Secondary | ICD-10-CM

## 2020-09-22 DIAGNOSIS — Z8639 Personal history of other endocrine, nutritional and metabolic disease: Secondary | ICD-10-CM

## 2020-09-22 NOTE — Telephone Encounter (Signed)
Spoke to patient to inform her that Jill Side has reviewed her labs from 04/2020 hospital admission and recommended for repeat CBC and BMP due to low HG and low potassium. Patient stated she will go to the [place] office for labs next week. All questions answered,patient voiced understanding.

## 2020-09-27 ENCOUNTER — Other Ambulatory Visit (INDEPENDENT_AMBULATORY_CARE_PROVIDER_SITE_OTHER): Payer: BC Managed Care – PPO

## 2020-09-27 DIAGNOSIS — Z8639 Personal history of other endocrine, nutritional and metabolic disease: Secondary | ICD-10-CM | POA: Diagnosis not present

## 2020-09-27 DIAGNOSIS — D649 Anemia, unspecified: Secondary | ICD-10-CM | POA: Diagnosis not present

## 2020-09-27 LAB — BASIC METABOLIC PANEL
BUN: 18 mg/dL (ref 6–23)
CO2: 29 mEq/L (ref 19–32)
Calcium: 9.6 mg/dL (ref 8.4–10.5)
Chloride: 102 mEq/L (ref 96–112)
Creatinine, Ser: 0.68 mg/dL (ref 0.40–1.20)
GFR: 98.78 mL/min (ref >60.00)
Glucose, Bld: 133 mg/dL — ABNORMAL HIGH (ref 70–99)
Potassium: 3.8 mEq/L (ref 3.5–5.1)
Sodium: 137 mEq/L (ref 135–145)

## 2020-09-27 LAB — CBC
HCT: 42.1 % (ref 36.0–46.0)
Hemoglobin: 13.3 g/dL (ref 12.0–15.0)
MCHC: 31.6 g/dL (ref 30.0–36.0)
MCV: 70.5 fl — ABNORMAL LOW (ref 78.0–100.0)
Platelets: 263 10*3/uL (ref 150.0–400.0)
RBC: 5.97 Mil/uL — ABNORMAL HIGH (ref 3.87–5.11)
RDW: 15.3 % (ref 11.5–15.5)
WBC: 8.1 10*3/uL (ref 4.0–10.5)

## 2020-09-28 DIAGNOSIS — R2689 Other abnormalities of gait and mobility: Secondary | ICD-10-CM | POA: Diagnosis not present

## 2020-09-28 DIAGNOSIS — G8929 Other chronic pain: Secondary | ICD-10-CM | POA: Diagnosis not present

## 2020-09-28 DIAGNOSIS — M47814 Spondylosis without myelopathy or radiculopathy, thoracic region: Secondary | ICD-10-CM | POA: Diagnosis not present

## 2020-10-04 ENCOUNTER — Other Ambulatory Visit: Payer: Self-pay | Admitting: Nurse Practitioner

## 2020-10-04 DIAGNOSIS — D649 Anemia, unspecified: Secondary | ICD-10-CM

## 2020-10-04 NOTE — Progress Notes (Signed)
LMOM for patient to call back. Lab orders placed in Epic.

## 2020-10-05 ENCOUNTER — Other Ambulatory Visit: Payer: Self-pay | Admitting: Nurse Practitioner

## 2020-10-05 ENCOUNTER — Other Ambulatory Visit (INDEPENDENT_AMBULATORY_CARE_PROVIDER_SITE_OTHER): Payer: BC Managed Care – PPO

## 2020-10-05 DIAGNOSIS — R718 Other abnormality of red blood cells: Secondary | ICD-10-CM

## 2020-10-05 DIAGNOSIS — D649 Anemia, unspecified: Secondary | ICD-10-CM

## 2020-10-05 LAB — IBC + FERRITIN
Ferritin: 76.4 ng/mL (ref 10.0–291.0)
Iron: 144 ug/dL (ref 42–145)
Saturation Ratios: 37.3 % (ref 20.0–50.0)
Transferrin: 276 mg/dL (ref 212.0–360.0)

## 2020-10-06 ENCOUNTER — Telehealth: Payer: Self-pay | Admitting: Physician Assistant

## 2020-10-06 NOTE — Telephone Encounter (Signed)
Received a new hem referral from [facility] for low mcv/hgb. Pt has been cld and scheduled to see Cassie on 1/20 at 1pm w/labs at 1230pm. Pt aware to arrive 20 minutes early.

## 2020-10-11 NOTE — Progress Notes (Signed)
[place] [phone]   [phone]  CONSULT NOTE  REFERRING PHYSICIAN: Carl Best   REASON FOR CONSULTATION:  Microcytic Anemia   HPI Judieth Mckown is a 55 y.o. female with a past medical history significant for small bowel obstruction 28 years ago and again in August 2021 and endometriosis status post hysterectomy and appendectomy 1995 is referred to clinic for microcytic anemia. The patient presents to the clinic with her friend, Kalman Shan, who helps interpret/facilitate communication during this visit.  The patient's evaluation began when she had a follow-up visit with gastroenterology on 09/19/2020 to discuss a screening colonoscopy.  The patient had lab work that day which noted mild anemia with a Hbg 11.8 and a low MCV of 71.7.  Her iron studies were unremarkable with a ferritin 76.4. Her iron was within normal limits at 144. Her transferrin was within normal limits at 276.0. her saturation ratio is 37.3%.  She is scheduled for a routine colonoscopy on 10/26/2020.  They subsequently referred her to the clinic for further evaluation of other etiologies of microcytic anemia.   To the patient's knowledge, she has no prior history of anemia. Per chart review, besides the 1x CBC with a Hbg of 11.8, her other CBC's do not show anemia. She has never required a blood transfusion.  She has never had any known vitamin deficiencies except she states she used to take B12 in the past but it caused GI upset. She also takes vitamin E for breast pain and vitamin C. She used to take vitamin D but caused abdominal upset. She started taking 65 mg of iron supplements last night because she thought it would help her anemia. She tolerated this well except for dark stool. She denies any family history of any blood disorders.  The patient denies any history of celiac's disease.  She does not believe she had a large portion of her small bowel resected for her prior SBO.  Patient denies any abnormal bleeding or bruising.  She denies history of hemorrhoids. Patient denies any blood thinner use except she takes aspirin about 1x per month for leg pain due to varicose veins.  She takes aleve if she every has significant breast pain. She does not crave ice.  She denies any history of bariatric surgery.  She denies any particular dietary habits. She estimates that she eats red meat daily. She denies any fever, chills, night sweats, or lymphadenopathy. She lost about 12 lbs since her hospitalization for the SBO in August 2021.   Her main concern today is related to her chronic intermittent dizziness. She reportedly experiences dizziness intermittently. She also reports fatigue.   Her family history consists of a mother who reportedly was an alcoholic and passed away from liver cancer. Her mother reportedly developed anemia later in life when she was diagnosed with cancer. Her father has diabetes mellitis. Her sister has multiple medical conditions including fibromyalgia, kidney disease, and she believes she may have lung cancer. She denies any other family history of anemia or malignancy.   The patient works cleaning houses. She denies known exposures at work to heavy metals. She is married. She does not have any children. Denies drug, alcohol and tobacco use.    HPI  Past Medical History:  Diagnosis Date  . Dizziness 01/2020  . Endometriosis 1990  . Hyperglycemia   . Jejunitis   . Ringing in ears, bilateral 01/2020  . Small bowel obstruction ([place]) 05/13/2020    Past Surgical History:  Procedure Laterality Date  . ABDOMINAL HYSTERECTOMY  1995   endometriosis  . APPENDECTOMY  1995   at time of TAH for endometriosis  . SMALL INTESTINE SURGERY  1998   ex lap, SB resection for SBO    Family History  Problem Relation Age of Onset  . Cancer Mother        liver cancer   . Cancer Sister     Social History Social History   Tobacco Use  . Smoking status:  Never Smoker  . Smokeless tobacco: Never Used  Vaping Use  . Vaping Use: Never used  Substance Use Topics  . Alcohol use: Not Currently  . Drug use: Never    Allergies  Allergen Reactions  . Anaprox [Naproxen] Swelling and Other (See Comments)    swelling and redness    Current Outpatient Medications  Medication Sig Dispense Refill  . PEG-KCl-NaCl-NaSulf-Na Asc-C (PLENVU) 140 g SOLR Take 1 kit by mouth as directed. Use coupon: BIN: [id number] PNC: CNRX Group: [id number] ID: [id number] 1 each 0   No current facility-administered medications for this visit.    REVIEW OF SYSTEMS:   Review of Systems  Constitutional: Positive for fatigue. Negative for appetite change, chills, fever and unexpected weight change.  HENT: Negative for mouth sores, nosebleeds, sore throat and trouble swallowing.   Eyes: Negative for eye problems and icterus.  Respiratory: Negative for cough, hemoptysis, shortness of breath and wheezing.   Cardiovascular: Negative for chest pain and leg swelling.  Gastrointestinal: Negative for abdominal pain, constipation, diarrhea, nausea and vomiting.  Genitourinary: Negative for bladder incontinence, difficulty urinating, dysuria, frequency and hematuria.   Musculoskeletal: Negative for back pain, gait problem, neck pain and neck stiffness.  Skin: Negative for itching and rash.  Neurological: Positive for dizziness. Negative for extremity weakness, gait problem, headaches, light-headedness and seizures.  Hematological: Negative for adenopathy. Does not bruise/bleed easily.  Psychiatric/Behavioral: Negative for confusion, depression and sleep disturbance. The patient is not nervous/anxious.     PHYSICAL EXAMINATION:  There were no vitals taken for this visit.  ECOG PERFORMANCE STATUS: 1 - Symptomatic but completely ambulatory  Physical Exam  Constitutional: Oriented to person, place, and time and well-developed, well-nourished, and in no distress.  HENT:  Head:  Normocephalic and atraumatic.  Mouth/Throat: Oropharynx is clear and moist. No oropharyngeal exudate.  Eyes: Conjunctivae are normal. Right eye exhibits no discharge. Left eye exhibits no discharge. No scleral icterus.  Neck: Normal range of motion. Neck supple.  Cardiovascular: Normal rate, regular rhythm, normal heart sounds and intact distal pulses.   Pulmonary/Chest: Effort normal and breath sounds normal. No respiratory distress. No wheezes. No rales.  Abdominal: Soft. Bowel sounds are normal. Exhibits no distension and no mass. There is no tenderness.  Musculoskeletal: Normal range of motion. Exhibits no edema.  Lymphadenopathy:    No cervical adenopathy.  Neurological: Alert and oriented to person, place, and time. Exhibits normal muscle tone. Gait normal. Coordination normal.  Skin: Skin is warm and dry. No rash noted. Not diaphoretic. No erythema. No pallor.  Psychiatric: Mood, memory and judgment normal.  Vitals reviewed.  LABORATORY DATA: Lab Results  Component Value Date   WBC 8.1 09/27/2020   HGB 13.3 09/27/2020   HCT 42.1 09/27/2020   MCV 70.5 (L) 09/27/2020   PLT 263.0 09/27/2020      Chemistry      Component Value Date/Time   NA 137 09/27/2020 1200   NA 144 01/16/2019 1425   K 3.8 09/27/2020 1200  CL 102 09/27/2020 1200   CO2 29 09/27/2020 1200   BUN 18 09/27/2020 1200   BUN 16 01/16/2019 1425   CREATININE 0.68 09/27/2020 1200      Component Value Date/Time   CALCIUM 9.6 09/27/2020 1200   ALKPHOS 42 05/13/2020 0912   AST 19 05/13/2020 0912   ALT 15 05/13/2020 0912   BILITOT 0.6 05/13/2020 0912   BILITOT 0.3 01/16/2019 1425       RADIOGRAPHIC STUDIES: No results found.  ASSESSMENT: This is a very pleasant 55 year old [place] female referred to the clinic for microcytic anemia    PLAN: The patient was seen with Dr. Julien Nordmann today.  We performed a repeat CBC, CMP, SPEP with immunofixation, iron studies, ferritin, folate, vitamin B12, and  hemoglobin electrophoresis.  The patient CBC demonstrates a normal hemoglobin of 14.1.  Her MCV continues to be low at 72.7.  Her CMP was unremarkable.  Her iron studies were unremarkable.  And her other labs are still pending at this time.  Dr. Julien Nordmann discussed that we will continue running the remaining lab studies.  Considering that her hemoglobin is normal, no specific treatment is recommended at this time and the patient may continue on observation and follow-up with her PCP.   I will mail the patient the results of all of her lab studies.  I will also personally call the patient if there is any further instructions that I need to relay to the patient.  The patient gave me permission to call her sister with further instructions as her sister speaks/understands [place].   Regarding the patient's concerns for her chronic medical conditions including her dizziness, intermittent abdominal pain, varicose veins, I encouraged her to follow-up with her PCP.  The patient will have her colonoscopy performed next month as scheduled.  The patient voices understanding of current disease status and treatment options and is in agreement with the current care plan.  All questions were answered. The patient knows to call the clinic with any problems, questions or concerns. We can certainly see the patient much sooner if necessary.  Thank you so much for allowing me to participate in the care of Dallas Scorsone. I will continue to follow up the patient with you and assist in her care.  I spent a total of 40-54 minutes with this encounter  Disclaimer: This note was dictated with voice recognition software. Similar sounding words can inadvertently be transcribed and may not be corrected upon review.   Lashea Goda L Zahriyah Joo October 11, 2020, 3:06 PM  ADDENDUM: Hematology/Oncology Attending: I had a face-to-face encounter with the patient today.  I reviewed her record, labs and recommended her care plan.   This is a very pleasant 55 years old Hispanic female with no significant past medical history except for small bowel obstruction twice, the first 1 was 28 years ago and the second 1 was in August 2021.  She also has history of endometriosis status post hysterectomy.  She was seen by gastroenterology for evaluation and consideration for screening colonoscopy.  Blood work performed at that visit showed mild anemia with hemoglobin of 11.8 and the patient has persistent microcytosis for several years.  Her previous blood work was unremarkable for anemia but showed the persistent microcytosis.  The patient denied having any history of iron deficiency anemia but she started taking oral iron tablet few days ago. I had a lengthy discussion with the patient today about her condition.  I order several studies to evaluate her persistent  microcytosis including repeat CBC which showed normal hemoglobin of 14.1 and hematocrit 46.0 but MCV was still low at 72.7.  She also has mild leukocytosis.  She has normal vitamin B12 level and serum folate.  We also order iron studies that was unremarkable for iron deficiency.  I also order serum protein electrophoresis as well as hemoglobin electrophoresis to rule out any underlying hemoglobinopathy. If this lab work are unremarkable, the patient will continue her routine follow-up visit and evaluation by her primary care physician and gastroenterology. She is also scheduled to have a colonoscopy in few weeks. She was advised to call immediately if she has any other concerning symptoms in the interval. The time spent by me reviewing her record, lab and consultation during this visit was 60 minutes Disclaimer: This note was dictated with voice recognition software. Similar sounding words can inadvertently be transcribed and may be missed upon review. Eilleen Kempf, MD 10/13/20

## 2020-10-13 ENCOUNTER — Other Ambulatory Visit: Payer: Self-pay | Admitting: Physician Assistant

## 2020-10-13 ENCOUNTER — Other Ambulatory Visit: Payer: Self-pay

## 2020-10-13 ENCOUNTER — Inpatient Hospital Stay: Payer: BC Managed Care – PPO

## 2020-10-13 ENCOUNTER — Inpatient Hospital Stay: Payer: BC Managed Care – PPO | Attending: Physician Assistant | Admitting: Physician Assistant

## 2020-10-13 DIAGNOSIS — R42 Dizziness and giddiness: Secondary | ICD-10-CM | POA: Diagnosis not present

## 2020-10-13 DIAGNOSIS — R718 Other abnormality of red blood cells: Secondary | ICD-10-CM | POA: Insufficient documentation

## 2020-10-13 DIAGNOSIS — D509 Iron deficiency anemia, unspecified: Secondary | ICD-10-CM

## 2020-10-13 DIAGNOSIS — R109 Unspecified abdominal pain: Secondary | ICD-10-CM | POA: Insufficient documentation

## 2020-10-13 DIAGNOSIS — D72829 Elevated white blood cell count, unspecified: Secondary | ICD-10-CM | POA: Diagnosis not present

## 2020-10-13 DIAGNOSIS — N644 Mastodynia: Secondary | ICD-10-CM | POA: Diagnosis not present

## 2020-10-13 LAB — CMP (CANCER CENTER ONLY)
ALT: 12 U/L (ref 0–44)
AST: 12 U/L — ABNORMAL LOW (ref 15–41)
Albumin: 4.4 g/dL (ref 3.5–5.0)
Alkaline Phosphatase: 56 U/L (ref 38–126)
Anion gap: 10 (ref 5–15)
BUN: 16 mg/dL (ref 6–20)
CO2: 26 mmol/L (ref 22–32)
Calcium: 10.2 mg/dL (ref 8.9–10.3)
Chloride: 103 mmol/L (ref 98–111)
Creatinine: 0.8 mg/dL (ref 0.44–1.00)
GFR, Estimated: >60 mL/min (ref >60)
Glucose, Bld: 96 mg/dL (ref 70–99)
Potassium: 4.4 mmol/L (ref 3.5–5.1)
Sodium: 139 mmol/L (ref 135–145)
Total Bilirubin: 0.5 mg/dL (ref 0.3–1.2)
Total Protein: 8.6 g/dL — ABNORMAL HIGH (ref 6.5–8.1)

## 2020-10-13 LAB — FOLATE: Folate: 13.8 ng/mL (ref >5.9)

## 2020-10-13 LAB — CBC WITH DIFFERENTIAL (CANCER CENTER ONLY)
Abs Immature Granulocytes: 0.03 10*3/uL (ref 0.00–0.07)
Basophils Absolute: 0.1 10*3/uL (ref 0.0–0.1)
Basophils Relative: 0 %
Eosinophils Absolute: 0 10*3/uL (ref 0.0–0.5)
Eosinophils Relative: 0 %
HCT: 46 % (ref 36.0–46.0)
Hemoglobin: 14.1 g/dL (ref 12.0–15.0)
Immature Granulocytes: 0 %
Lymphocytes Relative: 12 %
Lymphs Abs: 1.3 10*3/uL (ref 0.7–4.0)
MCH: 22.3 pg — ABNORMAL LOW (ref 26.0–34.0)
MCHC: 30.7 g/dL (ref 30.0–36.0)
MCV: 72.7 fL — ABNORMAL LOW (ref 80.0–100.0)
Monocytes Absolute: 0.7 10*3/uL (ref 0.1–1.0)
Monocytes Relative: 6 %
Neutro Abs: 9.2 10*3/uL — ABNORMAL HIGH (ref 1.7–7.7)
Neutrophils Relative %: 82 %
Platelet Count: 305 10*3/uL (ref 150–400)
RBC: 6.33 MIL/uL — ABNORMAL HIGH (ref 3.87–5.11)
RDW: 16.6 % — ABNORMAL HIGH (ref 11.5–15.5)
WBC Count: 11.3 10*3/uL — ABNORMAL HIGH (ref 4.0–10.5)
nRBC: 0 % (ref 0.0–0.2)

## 2020-10-13 LAB — VITAMIN B12: Vitamin B-12: 224 pg/mL (ref 180–914)

## 2020-10-13 LAB — IRON AND TIBC
Iron: 124 ug/dL (ref 41–142)
Saturation Ratios: 33 % (ref 21–57)
TIBC: 373 ug/dL (ref 236–444)
UIBC: 249 ug/dL (ref 120–384)

## 2020-10-13 LAB — FERRITIN: Ferritin: 98 ng/mL (ref 11–307)

## 2020-10-14 LAB — PROTEIN ELECTROPHORESIS, SERUM, WITH REFLEX
A/G Ratio: 1.2 (ref 0.7–1.7)
Albumin ELP: 4.2 g/dL (ref 2.9–4.4)
Alpha-1-Globulin: 0.2 g/dL (ref 0.0–0.4)
Alpha-2-Globulin: 0.9 g/dL (ref 0.4–1.0)
Beta Globulin: 1.6 g/dL — ABNORMAL HIGH (ref 0.7–1.3)
Gamma Globulin: 1 g/dL (ref 0.4–1.8)
Globulin, Total: 3.6 g/dL (ref 2.2–3.9)
Total Protein ELP: 7.8 g/dL (ref 6.0–8.5)

## 2020-10-17 ENCOUNTER — Telehealth: Payer: Self-pay | Admitting: Physician Assistant

## 2020-10-17 LAB — HGB FRACTIONATION CASCADE
Hgb A2: 2.6 % (ref 1.8–3.2)
Hgb A: 97.4 % (ref 96.4–98.8)
Hgb F: 0 % (ref 0.0–2.0)
Hgb S: 0 %

## 2020-10-17 NOTE — Telephone Encounter (Signed)
The patient asked that I call her sister with her lab results. All her labs looked normal. Her B12 was borderline low. She has a history of B12 deficiency. She had some trouble tolerating B12 in the past with abdominal discomfort. Recommend that she take a women's multivitamin with B12 or take a B12 OTC supplement a few times a week. I mailed a hard copy of her labs to her house per patient request.

## 2020-10-20 ENCOUNTER — Telehealth: Payer: Self-pay | Admitting: Nurse Practitioner

## 2020-10-20 DIAGNOSIS — R42 Dizziness and giddiness: Secondary | ICD-10-CM | POA: Diagnosis not present

## 2020-10-20 DIAGNOSIS — M533 Sacrococcygeal disorders, not elsewhere classified: Secondary | ICD-10-CM | POA: Diagnosis not present

## 2020-10-20 NOTE — Telephone Encounter (Signed)
Inbound call from patient stating she is experiencing back pain and is wanting to know if she should come in for a office visit before her procedure scheduled for 10/25/2020.  Please advise.

## 2020-10-20 NOTE — Telephone Encounter (Signed)
Spoke to patient who reports over the past few days having low back pain a burning sensation. She was wondering is she needed an appointment to be seen before her colonoscopy. Patient was advised that her pain sounds like musculoskeletal pain and my want to contact her PCP if pain relievers were not helping the pain. Patient voiced understanding.

## 2020-10-21 DIAGNOSIS — M47814 Spondylosis without myelopathy or radiculopathy, thoracic region: Secondary | ICD-10-CM | POA: Diagnosis not present

## 2020-10-21 DIAGNOSIS — M533 Sacrococcygeal disorders, not elsewhere classified: Secondary | ICD-10-CM | POA: Diagnosis not present

## 2020-10-25 ENCOUNTER — Ambulatory Visit (AMBULATORY_SURGERY_CENTER): Payer: BC Managed Care – PPO | Admitting: Internal Medicine

## 2020-10-25 ENCOUNTER — Other Ambulatory Visit: Payer: Self-pay

## 2020-10-25 ENCOUNTER — Encounter: Payer: Self-pay | Admitting: Internal Medicine

## 2020-10-25 VITALS — BP 93/55 | HR 67 | Temp 97.5°F | Resp 11 | Ht 64.0 in | Wt 136.0 lb

## 2020-10-25 DIAGNOSIS — Z1211 Encounter for screening for malignant neoplasm of colon: Secondary | ICD-10-CM

## 2020-10-25 MED ORDER — SODIUM CHLORIDE 0.9 % IV SOLN: 500.0000 mL | Freq: Once | INTRAVENOUS | Status: DC

## 2020-10-25 NOTE — Patient Instructions (Signed)
Handout given:  Diverticulosis Resume previous diet Continue any current medications Repeat colonoscopy in 10 years  YOU HAD AN ENDOSCOPIC PROCEDURE TODAY AT THE [facility]:   Refer to the procedure report that was given to you for any specific questions about what was found during the examination.  If the procedure report does not answer your questions, please call your gastroenterologist to clarify.  If you requested that your care partner not be given the details of your procedure findings, then the procedure report has been included in a sealed envelope for you to review at your convenience later.  YOU SHOULD EXPECT: Some feelings of bloating in the abdomen. Passage of more gas than usual.  Walking can help get rid of the air that was put into your GI tract during the procedure and reduce the bloating. If you had a lower endoscopy (such as a colonoscopy or flexible sigmoidoscopy) you may notice spotting of blood in your stool or on the toilet paper. If you underwent a bowel prep for your procedure, you may not have a normal bowel movement for a few days.  Please Note:  You might notice some irritation and congestion in your nose or some drainage.  This is from the oxygen used during your procedure.  There is no need for concern and it should clear up in a day or so.  SYMPTOMS TO REPORT IMMEDIATELY:   Following lower endoscopy (colonoscopy or flexible sigmoidoscopy):  Excessive amounts of blood in the stool  Significant tenderness or worsening of abdominal pains  Swelling of the abdomen that is new, acute  Fever of 100F or higher  For urgent or emergent issues, a gastroenterologist can be reached at any hour by calling [phone]. Do not use MyChart messaging for urgent concerns.   DIET:  We do recommend a small meal at first, but then you may proceed to your regular diet.  Drink plenty of fluids but you should avoid alcoholic beverages for 24 hours.  ACTIVITY:  You  should plan to take it easy for the rest of today and you should NOT DRIVE or use heavy machinery until tomorrow (because of the sedation medicines used during the test).    FOLLOW UP: Our staff will call the number listed on your records 48-72 hours following your procedure to check on you and address any questions or concerns that you may have regarding the information given to you following your procedure. If we do not reach you, we will leave a message.  We will attempt to reach you two times.  During this call, we will ask if you have developed any symptoms of COVID 19. If you develop any symptoms (ie: fever, flu-like symptoms, shortness of breath, cough etc.) before then, please call [phone].  If you test positive for Covid 19 in the 2 weeks post procedure, please call and report this information to [place].    If any biopsies were taken you will be contacted by phone or by letter within the next 1-3 weeks.  Please call us at [phone] if you have not heard about the biopsies in 3 weeks.   SIGNATURES/CONFIDENTIALITY: You and/or your care partner have signed paperwork which will be entered into your electronic medical record.  These signatures attest to the fact that that the information above on your After Visit Summary has been reviewed and is understood.  Full responsibility of the confidentiality of this discharge information lies with you and/or your care-partner.

## 2020-10-25 NOTE — Op Note (Signed)
[facility] Patient Name: [name] Procedure Date: 10/25/2020 3:04 PM MRN: [id number] Endoscopist: Beverley Fiedler , MD Age: 55 Referring MD:  Date of Birth: [birth year] Gender: Female Account #: [id number] Procedure:                Colonoscopy Indications:              Screening for colorectal malignant neoplasm Medicines:                Monitored Anesthesia Care Procedure:                Pre-Anesthesia Assessment:                           - Prior to the procedure, a History and Physical                            was performed, and patient medications and                            allergies were reviewed. The patient's tolerance of                            previous anesthesia was also reviewed. The risks                            and benefits of the procedure and the sedation                            options and risks were discussed with the patient.                            All questions were answered, and informed consent                            was obtained. Prior Anticoagulants: The patient has                            taken no previous anticoagulant or antiplatelet                            agents. ASA Grade Assessment: II - A patient with                            mild systemic disease. After reviewing the risks                            and benefits, the patient was deemed in                            satisfactory condition to undergo the procedure.                           After obtaining informed consent, the colonoscope  was passed under direct vision. Throughout the                            procedure, the patient's blood pressure, pulse, and                            oxygen saturations were monitored continuously. The                            Olympus [id number]-[id number]) Colonoscope was                            introduced through the anus and advanced to the                            cecum, identified by  appendiceal orifice and                            ileocecal valve. The colonoscopy was performed                            without difficulty. The patient tolerated the                            procedure well. The quality of the bowel                            preparation was good. The ileocecal valve,                            appendiceal orifice, and rectum were photographed. Scope In: 3:09:39 PM Scope Out: 3:28:59 PM Scope Withdrawal Time: 0 hours 11 minutes 48 seconds  Total Procedure Duration: 0 hours 19 minutes 20 seconds  Findings:                 The digital rectal exam was normal.                           Scattered small-mouthed diverticula were found in                            the sigmoid colon and descending colon.                           The exam was otherwise without abnormality on                            direct and retroflexion views. Complications:            No immediate complications. Estimated Blood Loss:     Estimated blood loss: none. Impression:               - Diverticulosis in the sigmoid colon and in the                            descending colon.                           -  The examination was otherwise normal on direct                            and retroflexion views.                           - No specimens collected. Recommendation:           - Patient has a contact number available for                            emergencies. The signs and symptoms of potential                            delayed complications were discussed with the                            patient. Return to normal activities tomorrow.                            Written discharge instructions were provided to the                            patient.                           - Resume previous diet.                           - Continue present medications.                           - Repeat colonoscopy in 10 years for screening                            purposes. Beverley Fiedler, MD 10/25/2020 3:30:26 PM This report has been signed electronically.

## 2020-10-25 NOTE — Progress Notes (Signed)
To PACU, VSS. Report to [email or web address] 

## 2020-10-27 ENCOUNTER — Telehealth: Payer: Self-pay | Admitting: *Deleted

## 2020-10-27 NOTE — Telephone Encounter (Signed)
1. Have you developed a fever since your procedure? no  2.   Have you had an respiratory symptoms (SOB or cough) since your procedure? no  3.   Have you tested positive for COVID 19 since your procedure no  4.   Have you had any family members/close contacts diagnosed with the COVID 19 since your procedure?  no   If yes to any of these questions please route to Laverna Peace, RN and Karlton Lemon, RN Follow up Call-  Call back number 10/25/2020  Post procedure Call Back phone  # [phone]  Permission to leave phone message Yes  Some recent data might be hidden     Patient questions:  Do you have a fever, pain , or abdominal swelling? No. Pain Score  0 *  Have you tolerated food without any problems? Yes.    Have you been able to return to your normal activities? Yes.    Do you have any questions about your discharge instructions: Diet   No. Medications  No. Follow up visit  No.  Do you have questions or concerns about your Care? No.  Actions: * If pain score is 4 or above: No action needed, pain <4.

## 2020-11-03 ENCOUNTER — Other Ambulatory Visit: Payer: Self-pay

## 2020-11-04 ENCOUNTER — Encounter: Payer: Self-pay | Admitting: Internal Medicine

## 2020-11-04 ENCOUNTER — Ambulatory Visit: Payer: BC Managed Care – PPO | Admitting: Internal Medicine

## 2020-11-04 VITALS — BP 110/70 | HR 69 | Temp 98.1°F | Wt 138.1 lb

## 2020-11-04 DIAGNOSIS — R918 Other nonspecific abnormal finding of lung field: Secondary | ICD-10-CM | POA: Diagnosis not present

## 2020-11-04 DIAGNOSIS — R112 Nausea with vomiting, unspecified: Secondary | ICD-10-CM

## 2020-11-04 DIAGNOSIS — R1011 Right upper quadrant pain: Secondary | ICD-10-CM | POA: Diagnosis not present

## 2020-11-04 DIAGNOSIS — R42 Dizziness and giddiness: Secondary | ICD-10-CM

## 2020-11-04 LAB — CBC WITH DIFFERENTIAL/PLATELET
Basophils Absolute: 0 10*3/uL (ref 0.0–0.1)
Basophils Relative: 0.4 % (ref 0.0–3.0)
Eosinophils Absolute: 0.1 10*3/uL (ref 0.0–0.7)
Eosinophils Relative: 0.7 % (ref 0.0–5.0)
HCT: 38.1 % (ref 36.0–46.0)
Hemoglobin: 12.1 g/dL (ref 12.0–15.0)
Lymphocytes Relative: 21.2 % (ref 12.0–46.0)
Lymphs Abs: 2 10*3/uL (ref 0.7–4.0)
MCHC: 31.8 g/dL (ref 30.0–36.0)
MCV: 71.3 fl — ABNORMAL LOW (ref 78.0–100.0)
Monocytes Absolute: 0.7 10*3/uL (ref 0.1–1.0)
Monocytes Relative: 7 % (ref 3.0–12.0)
Neutro Abs: 6.6 10*3/uL (ref 1.4–7.7)
Neutrophils Relative %: 70.7 % (ref 43.0–77.0)
Platelets: 224 10*3/uL (ref 150.0–400.0)
RBC: 5.34 Mil/uL — ABNORMAL HIGH (ref 3.87–5.11)
RDW: 15.3 % (ref 11.5–15.5)
WBC: 9.3 10*3/uL (ref 4.0–10.5)

## 2020-11-04 LAB — COMPREHENSIVE METABOLIC PANEL
ALT: 7 U/L (ref 0–35)
AST: 12 U/L (ref 0–37)
Albumin: 4.2 g/dL (ref 3.5–5.2)
Alkaline Phosphatase: 39 U/L (ref 39–117)
BUN: 18 mg/dL (ref 6–23)
CO2: 28 mEq/L (ref 19–32)
Calcium: 9.2 mg/dL (ref 8.4–10.5)
Chloride: 105 mEq/L (ref 96–112)
Creatinine, Ser: 0.65 mg/dL (ref 0.40–1.20)
GFR: 99.79 mL/min (ref >60.00)
Glucose, Bld: 98 mg/dL (ref 70–99)
Potassium: 3.5 mEq/L (ref 3.5–5.1)
Sodium: 141 mEq/L (ref 135–145)
Total Bilirubin: 0.4 mg/dL (ref 0.2–1.2)
Total Protein: 7.1 g/dL (ref 6.0–8.3)

## 2020-11-04 NOTE — Progress Notes (Signed)
Established Patient Office Visit     This visit occurred during the SARS-CoV-2 public health emergency.  Safety protocols were in place, including screening questions prior to the visit, additional usage of staff PPE, and extensive cleaning of exam room while observing appropriate contact time as indicated for disinfecting solutions.    CC/Reason for Visit: Dizziness, right upper quadrant pain, nausea, vomiting  HPI: [name] is a 55 y.o. female who is coming in today for the above mentioned reasons.  Since I last saw her some new issues have occurred:  1.  She had a colonoscopy 10 days ago.  She did well.  Everything was clear and she will be a 10-year callback.  2.  She has developed dizziness with head position changes.  Especially when bending down sometimes when turning to the right.  3.  For the past 2 weeks she has noticed right upper quadrant pain after eating only.  Sometimes radiates up into her shoulder.  If she has just water or a very heavy meal she might have some nausea and vomiting.  4.  She had a pulmonary nodule, 66-month CT scan was recommended, it is now time for this.    5.  She has been having some tailbone pain.  Went to an urgent care where x-rays supposedly showed inflammation of tailbone.  She is improving with NSAIDs and icing.  She purchased a donut pillow which has also been helping significantly.   Past Medical/Surgical History: Past Medical History:  Diagnosis Date  . Dizziness 01/2020  . Endometriosis 1990  . Hyperglycemia   . Jejunitis   . Ringing in ears, bilateral 01/2020  . Small bowel obstruction (HCC) 05/13/2020    Past Surgical History:  Procedure Laterality Date  . ABDOMINAL HYSTERECTOMY  1995   endometriosis  . APPENDECTOMY  1995   at time of TAH for endometriosis  . SMALL INTESTINE SURGERY  1998   ex lap, SB resection for SBO    Social History:  reports that she has never smoked. She has never used smokeless tobacco.  She reports previous alcohol use. She reports that she does not use drugs.  Allergies: Allergies  Allergen Reactions  . Anaprox [Naproxen] Swelling and Other (See Comments)    swelling and redness    Family History:  Family History  Problem Relation Age of Onset  . Cancer Mother        liver cancer   . Cancer Sister     No current outpatient medications on file.  Review of Systems:  Constitutional: Denies fever, chills, diaphoresis, appetite change and fatigue.  HEENT: Denies photophobia, eye pain, redness, hearing loss, ear pain, congestion, sore throat, rhinorrhea, sneezing, mouth sores, trouble swallowing, neck pain, neck stiffness and tinnitus.   Respiratory: Denies SOB, DOE, cough, chest tightness,  and wheezing.   Cardiovascular: Denies chest pain, palpitations and leg swelling.  Gastrointestinal: Denies diarrhea, constipation, blood in stool and abdominal distention.  Genitourinary: Denies dysuria, urgency, frequency, hematuria, flank pain and difficulty urinating.  Endocrine: Denies: hot or cold intolerance, sweats, changes in hair or nails, polyuria, polydipsia. Musculoskeletal: Denies myalgias, back pain, joint swelling, arthralgias and gait problem.  Skin: Denies pallor, rash and wound.  Neurological: Denies dizziness, seizures, syncope, weakness, light-headedness, numbness and headaches.  Hematological: Denies adenopathy. Easy bruising, personal or family bleeding history  Psychiatric/Behavioral: Denies suicidal ideation, mood changes, confusion, nervousness, sleep disturbance and agitation    Physical Exam: Vitals:   11/04/20 1352  BP: 110/70  Pulse: 69  Temp: 98.1 F (36.7 C)  TempSrc: Oral  SpO2: 98%  Weight: 138 lb 1.6 oz (62.6 kg)    Body mass index is 23.7 kg/m.   Constitutional: NAD, calm, comfortable Eyes: PERRL, lids and conjunctivae normal ENMT: Mucous membranes are moist.Tympanic membrane is pearly white, no erythema or  bulging. Respiratory: clear to auscultation bilaterally, no wheezing, no crackles. Normal respiratory effort. No accessory muscle use.  Cardiovascular: Regular rate and rhythm, no murmurs / rubs / gallops. No extremity edema.  Abdomen: Slight tenderness to palpation right upper quadrant, no masses palpated. No hepatosplenomegaly. Bowel sounds positive.  Neurologic: Grossly intact and nonfocal. Psychiatric: Normal judgment and insight. Alert and oriented x 3. Normal mood.    Impression and Plan:  RUQ pain  Non-intractable vomiting with nausea, unspecified vomiting type -Right upper quadrant pain radiating into her shoulder with postprandial nausea and vomiting makes me think about gallbladder pathology. -CBC and comprehensive metabolic panel today. -Right upper quadrant ultrasound will be scheduled as well. -Further work-up to follow.  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 -She is not orthostatic. -I will refer her to vestibular therapy.  Abnormal findings on diagnostic imaging of lung  - Plan: CT Chest W Contrast as 19-month follow-up.   Patient Instructions  -Nice seeing you today!!  -Lab work today; will notify you once results are available.  -CT chest and abdominal US ordered today.     Chaya Jan, MD  [facility] at [facility]

## 2020-11-04 NOTE — Addendum Note (Signed)
Addended by: Kern Reap B on: 11/04/2020 03:35 PM   Modules accepted: Orders

## 2020-11-04 NOTE — Patient Instructions (Signed)
-  Nice seeing you today!!  -Lab work today; will notify you once results are available.  -CT chest and abdominal US ordered today.

## 2020-11-07 DIAGNOSIS — N952 Postmenopausal atrophic vaginitis: Secondary | ICD-10-CM | POA: Diagnosis not present

## 2020-11-07 DIAGNOSIS — N898 Other specified noninflammatory disorders of vagina: Secondary | ICD-10-CM | POA: Diagnosis not present

## 2020-11-14 ENCOUNTER — Ambulatory Visit
Admission: RE | Admit: 2020-11-14 | Discharge: 2020-11-14 | Disposition: A | Discharge status: Home / Self Care | Payer: BC Managed Care – PPO | Source: Ambulatory Visit | Attending: Internal Medicine | Admitting: Internal Medicine

## 2020-11-14 DIAGNOSIS — R1011 Right upper quadrant pain: Secondary | ICD-10-CM

## 2020-11-14 DIAGNOSIS — R112 Nausea with vomiting, unspecified: Secondary | ICD-10-CM

## 2020-11-15 ENCOUNTER — Other Ambulatory Visit: Payer: Self-pay | Admitting: *Deleted

## 2020-11-15 ENCOUNTER — Other Ambulatory Visit: Payer: Self-pay | Admitting: Internal Medicine

## 2020-11-15 ENCOUNTER — Telehealth: Payer: Self-pay | Admitting: Internal Medicine

## 2020-11-15 DIAGNOSIS — R1011 Right upper quadrant pain: Secondary | ICD-10-CM

## 2020-11-15 MED ORDER — PANTOPRAZOLE SODIUM 40 MG PO TBEC: 40.0000 mg | DELAYED_RELEASE_TABLET | Freq: Every day | ORAL | 1 refills | Status: DC

## 2020-11-15 NOTE — Telephone Encounter (Signed)
Patient is calling and wanted to know if provider was going to send in a prescription for the pain she is her experiencing in her side, please advise. CB is [phone]

## 2020-11-15 NOTE — Telephone Encounter (Signed)
Answered in lab result note.

## 2020-11-16 ENCOUNTER — Telehealth: Payer: Self-pay | Admitting: *Deleted

## 2020-11-16 NOTE — Telephone Encounter (Signed)
Prior Auth started for Protonix 40 mg Key: [id number]

## 2020-11-18 NOTE — Telephone Encounter (Signed)
PA is denied for Protonix - does not meet the definition of Medical Necessity found in the member's benefit booklet  She should try alternatives :  Omeprazole or esomeprazole, which are over the counter and are not covered by pharmacy benefits .

## 2020-11-18 NOTE — Telephone Encounter (Signed)
She has failed treatment with OTC omeprazole. Can we include that in her PA?

## 2020-11-22 ENCOUNTER — Telehealth: Payer: Self-pay | Admitting: Internal Medicine

## 2020-11-22 DIAGNOSIS — R1011 Right upper quadrant pain: Secondary | ICD-10-CM

## 2020-11-22 NOTE — Telephone Encounter (Signed)
Pt is calling in stating that she is not having stomach pain it is only her R side and think it maybe her gallbladder and would like to have a HIDA scan and pt did not pick up the Rx pantoprazole (PROTONIX) 40 MG due to it being so expensive.  Pt stressed that it is her R side and it really bothers her at night when she is sleeping not her stomach they told her that her stomach is fine and now she would like to see if it is her gallbladder.

## 2020-11-22 NOTE — Telephone Encounter (Signed)
Ok to schedule HIDA scan per request, altho GB pathology unlikely based on negative labs and [place]. I do believe she has GERD, can try omeprazole OTC daily if insurance will not cover protonix.

## 2020-11-23 NOTE — Addendum Note (Signed)
Addended by: Kern Reap B on: 11/23/2020 11:32 AM   Modules accepted: Orders

## 2020-11-23 NOTE — Telephone Encounter (Signed)
Hida scan  Patient is aware of the medication.

## 2020-11-30 ENCOUNTER — Ambulatory Visit (HOSPITAL_COMMUNITY)
Admission: RE | Admit: 2020-11-30 | Discharge: 2020-11-30 | Disposition: A | Discharge status: Home / Self Care | Payer: BC Managed Care – PPO | Source: Ambulatory Visit | Attending: Internal Medicine | Admitting: Internal Medicine

## 2020-11-30 DIAGNOSIS — R1011 Right upper quadrant pain: Secondary | ICD-10-CM | POA: Insufficient documentation

## 2020-11-30 DIAGNOSIS — R109 Unspecified abdominal pain: Secondary | ICD-10-CM | POA: Diagnosis not present

## 2020-11-30 MED ORDER — TECHNETIUM TC 99M MEBROFENIN IV KIT
5.0000 | PACK | Freq: Once | INTRAVENOUS | Status: AC | PRN
  Administered 2020-11-30: 5.3 via INTRAVENOUS

## 2020-12-01 ENCOUNTER — Other Ambulatory Visit: Payer: Self-pay | Admitting: *Deleted

## 2020-12-01 DIAGNOSIS — R1011 Right upper quadrant pain: Secondary | ICD-10-CM

## 2020-12-05 ENCOUNTER — Telehealth: Payer: Self-pay | Admitting: Internal Medicine

## 2020-12-05 NOTE — Telephone Encounter (Signed)
Patient is calling and stated that she has a CT SCAN on 3/16 and wanted to see if the provider could put in an order to go lower because she still has pain on the right side, please advise. CB is [phone]

## 2020-12-06 NOTE — Telephone Encounter (Signed)
Patient called to see if Dr. Ardyth Harps can add to the CT scan for them to go lower because she has pain on the right side.   She was wonder if Dr. Ardyth Harps can add to the order or call the place where she is having the scan done at.  She has to be there at 7:40 AM and would like this completed by today.

## 2020-12-06 NOTE — Telephone Encounter (Signed)
See no need for repeat abd CT, she had one in 8/21. This CT is of chest to follow pulm nodules. She NEEDS to take a daily PPI. If unable to afford protonix, then OTC omeprazole 40 mg daily.

## 2020-12-07 ENCOUNTER — Other Ambulatory Visit: Payer: Self-pay

## 2020-12-07 ENCOUNTER — Ambulatory Visit
Admission: RE | Admit: 2020-12-07 | Discharge: 2020-12-07 | Disposition: A | Discharge status: Home / Self Care | Payer: BC Managed Care – PPO | Source: Ambulatory Visit | Attending: Internal Medicine | Admitting: Internal Medicine

## 2020-12-07 DIAGNOSIS — R918 Other nonspecific abnormal finding of lung field: Secondary | ICD-10-CM

## 2020-12-07 MED ORDER — IOPAMIDOL (ISOVUE-300) INJECTION 61%
75.0000 mL | Freq: Once | INTRAVENOUS | Status: AC | PRN
  Administered 2020-12-07: 75 mL via INTRAVENOUS

## 2020-12-08 MED ORDER — OMEPRAZOLE 40 MG PO CPDR: 40.0000 mg | DELAYED_RELEASE_CAPSULE | Freq: Every day | ORAL | 3 refills | Status: DC

## 2020-12-08 NOTE — Telephone Encounter (Signed)
Patient is aware and Rx sent 

## 2020-12-13 ENCOUNTER — Telehealth: Payer: Self-pay | Admitting: *Deleted

## 2020-12-13 NOTE — Telephone Encounter (Signed)
Prior auth started for  Omeprazole 40 mg Key: [id number] - Rx #: [id number]

## 2020-12-20 ENCOUNTER — Encounter: Payer: Self-pay | Admitting: Nurse Practitioner

## 2020-12-20 ENCOUNTER — Ambulatory Visit: Payer: BC Managed Care – PPO | Admitting: Nurse Practitioner

## 2020-12-20 VITALS — BP 120/74 | HR 76 | Ht 64.0 in | Wt 138.0 lb

## 2020-12-20 DIAGNOSIS — R1011 Right upper quadrant pain: Secondary | ICD-10-CM

## 2020-12-20 MED ORDER — METHOCARBAMOL 750 MG PO TABS: 750.0000 mg | ORAL_TABLET | Freq: Every day | ORAL | 0 refills | Status: DC

## 2020-12-20 MED ORDER — IBUPROFEN 600 MG PO TABS: 600.0000 mg | ORAL_TABLET | Freq: Three times a day (TID) | ORAL | 0 refills | Status: DC

## 2020-12-20 NOTE — Telephone Encounter (Signed)
Did they give a reason? And did they have an alternative they would prefer?

## 2020-12-20 NOTE — Patient Instructions (Signed)
We have sent the following medications to your pharmacy for you to pick up at your convenience: Robaxin, ibuprofen  Please purchase over the counter salonpas and use as directed for 7 days.  If after 7 days you see no improvement please call us back to set up an EGD.  I appreciate the opportunity to care for you. Willette Cluster, NP-C

## 2020-12-20 NOTE — Progress Notes (Signed)
ASSESSMENT AND PLAN     # 55 year old female with 1 month history of intermittent RUQ discomfort not consistently related to eating, often noticeable when vacuuming and improved with application of ice pack. Suspect musculoskeletal origin of discomfort. Normal liver chemistries, normal RUQ [place] and normal HIDA.  --1 week trial of ibuprofen 600 mg 3 times daily, Robaxin at bedtime, Salonpas patches.  --I asked her to try and avoid bending, twisting and other activity involving the RUQ over the next several days --Patient will call us with a condition update in a week.  If absolutely no improvement with treatment for presumed musculoskeletal pain then consider EGD  # History of small bowel obstruction status post resection 28 years ago, small bowel resection 04/2020 resolved without surgical intervention    HISTORY OF PRESENT ILLNESS    Chief Complaint : abdominal pain  Rennie is a 55 year old female known to Dr. Rhea Belton from a screening colonoscopy in February of this year.  She has a PMH of endometriosis, hysterectomy , SBOl  status post remote resection then recurrent SBO in August 2021 ( resolved with conservative measures).    Ismael is here for evaluation of burning RUQ discomfort for approximately one month.  She clarifies that this is more of a discomfort than actual pain . She has been having right mid back pain since the beginning of the year but is unable to tell if there is any relationship to the RUQ discomfort. Episodes occur at least once daily and last a few minutes. Discomfort is not consistently related to eating. 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 Ice packs help the discomfort, she hasn't tried a heating pad. She hasn't tried any pain relievers because she doesn't like to take medication and the discomfort is significant enough to warrant pain reducer. However she takes Ibuprofen sometimes for headaches.  At one point patient  was advised to try a PPI for the RUQ discomfort. She didn't purchase the medication since she doesn't believe the discomfort is acid related. She has no associated nausea  Her BMs are normal.   DATA REVIEWED:  11/14/2020 RUQ ultrasound for nausea, vomiting, RUQ pain --Normal  11/30/2020 HIDA scan for abdominal pain --Normal, normal EF  12/07/2020 chest CT with contrast to follow-up on abnormal x-ray, lung nodule --A few small right middle lobe primarily subpleural nodules measuring up to 3 mm   PREVIOUS ENDOSCOPIC EVALUATIONS / PERTINENT STUDIES:   February 2022 screening colonoscopy --Left-sided diverticulosis   Past Medical History:  Diagnosis Date  . Endometriosis 1990  . Hyperglycemia   . Jejunitis   . Ringing in ears, bilateral 01/2020  . Small bowel obstruction (HCC) 05/13/2020    Current Medications, Allergies, Past Surgical History, Family History and Social History were reviewed in [employer] record.   Current Outpatient Medications  Medication Sig Dispense Refill   No current facility-administered medications for this visit.    Review of Systems: No chest pain. No shortness of breath. No urinary complaints.   PHYSICAL EXAM :    Wt Readings from Last 3 Encounters:  11/04/20 138 lb 1.6 oz (62.6 kg)  10/25/20 136 lb (61.7 kg)  09/19/20 136 lb 9.6 oz (62 kg)    BP 120/74 (BP Location: Left Arm, Patient Position: Sitting)   Pulse 76   Ht 5\' 4"  (1.626 m)   Wt 138 lb (62.6 kg)   SpO2 99%   BMI 23.69 kg/m  Constitutional:  Pleasant female in no acute distress. Psychiatric: Normal mood and affect. Behavior is normal. EENT: Pupils normal.  Conjunctivae are normal. No scleral icterus. Neck supple.  Cardiovascular: Normal rate, regular rhythm. No edema Pulmonary/chest: Effort normal and breath sounds normal. No wheezing, rales or rhonchi. Abdominal: Soft, nondistended, mild localized RUQ tenderness .  Positive Carnett's sign.  Bowel  sounds active throughout. There are no masses palpable. No hepatomegaly. Neurological: Alert and oriented to person place and time. Skin: Skin is warm and dry. No rashes noted.  I spent 30 minutes total reviewing records, obtaining history, performing exam, counseling patient and documenting visit / findings.    Willette Cluster, NP  12/20/2020, 8:33 AM

## 2020-12-20 NOTE — Telephone Encounter (Signed)
Prior Auth was denied.

## 2020-12-21 NOTE — Telephone Encounter (Signed)
Can u let her know and ask her to take OTC omeprazole 40 mg daily?

## 2020-12-21 NOTE — Telephone Encounter (Signed)
They do not have an alternative.  They will not cover any medication that is available over the counter.

## 2020-12-21 NOTE — Telephone Encounter (Signed)
FYI  Spoke with patient and she went to GI and she will follow their recommendations before trying omeprazole.   --1 week trial of ibuprofen 600 mg 3 times daily, Robaxin at bedtime, Salonpas patches.

## 2021-01-10 NOTE — Progress Notes (Signed)
Addendum: Reviewed and agree with assessment and management plan. Kem Parcher M, MD  

## 2021-01-11 ENCOUNTER — Ambulatory Visit (INDEPENDENT_AMBULATORY_CARE_PROVIDER_SITE_OTHER): Payer: BC Managed Care – PPO | Admitting: Internal Medicine

## 2021-01-11 ENCOUNTER — Other Ambulatory Visit: Payer: Self-pay

## 2021-01-11 ENCOUNTER — Encounter: Payer: Self-pay | Admitting: Internal Medicine

## 2021-01-11 VITALS — BP 102/70 | HR 64 | Temp 97.9°F | Ht 64.0 in | Wt 141.5 lb

## 2021-01-11 DIAGNOSIS — Z0000 Encounter for general adult medical examination without abnormal findings: Secondary | ICD-10-CM | POA: Diagnosis not present

## 2021-01-11 DIAGNOSIS — Z23 Encounter for immunization: Secondary | ICD-10-CM

## 2021-01-11 NOTE — Progress Notes (Signed)
Established Patient Office Visit     This visit occurred during the SARS-CoV-2 public health emergency.  Safety protocols were in place, including screening questions prior to the visit, additional usage of staff PPE, and extensive cleaning of exam room while observing appropriate contact time as indicated for disinfecting solutions.    CC/Reason for Visit: Annual preventive exam  HPI: [name] is a 55 y.o. female who is coming in today for the above mentioned reasons.  She has no past medical history of significance.  We have been working up for right upper quadrant abdominal pain for some time now.  She had a negative ultrasound as well as a HIDA scan.  Now it appears as though pain is more muscular.  She has seen GI with plans to possibly do an EGD in the future if pain does not resolve.  She is due for her fourth COVID-vaccine and her second shingles vaccine otherwise immunizations are up-to-date.  She had a colonoscopy in February 2022 and is a 10-year callback.  She had a mammogram in October 2021 and had a recent Pap smear earlier this year with GYN that was reported as negative.  She has routine eye and dental care.  She is nonfasting today and will return next week for labs.  Past Medical/Surgical History: Past Medical History:  Diagnosis Date  . Dizziness 01/2020  . Endometriosis 1990  . Hyperglycemia   . Jejunitis   . Ringing in ears, bilateral 01/2020  . Small bowel obstruction ([place]) 05/13/2020    Past Surgical History:  Procedure Laterality Date  . ABDOMINAL HYSTERECTOMY  1995   endometriosis  . APPENDECTOMY  1995   at time of TAH for endometriosis  . SMALL INTESTINE SURGERY  1998   ex lap, SB resection for SBO    Social History:  reports that she has never smoked. She has never used smokeless tobacco. She reports previous alcohol use. She reports that she does not use drugs.  Allergies: Allergies  Allergen Reactions  . Anaprox [Naproxen] Swelling and  Other (See Comments)    swelling and redness    Family History:  Family History  Problem Relation Age of Onset  . Cancer Mother        liver cancer   . Cancer Sister      Current Outpatient Medications:  .  ibuprofen (ADVIL) 600 MG tablet, Take 1 tablet (600 mg total) by mouth 3 (three) times daily., Disp: 21 tablet, Rfl: 0 .  Multiple Vitamin (MULTIVITAMIN) tablet, Take 1 tablet by mouth daily., Disp: , Rfl:  .  vitamin E 600 UNIT capsule, Take 600 Units by mouth daily., Disp: , Rfl:  .  methocarbamol (ROBAXIN-750) 750 MG tablet, Take 1 tablet (750 mg total) by mouth at bedtime. (Patient not taking: Reported on 01/11/2021), Disp: 7 tablet, Rfl: 0  Review of Systems:  Constitutional: Denies fever, chills, diaphoresis, appetite change and fatigue.  HEENT: Denies photophobia, eye pain, redness, hearing loss, ear pain, congestion, sore throat, rhinorrhea, sneezing, mouth sores, trouble swallowing, neck pain, neck stiffness and tinnitus.   Respiratory: Denies SOB, DOE, cough, chest tightness,  and wheezing.   Cardiovascular: Denies chest pain, palpitations and leg swelling.  Gastrointestinal: Denies nausea, vomiting, abdominal pain, diarrhea, constipation, blood in stool and abdominal distention.  Genitourinary: Denies dysuria, urgency, frequency, hematuria, flank pain and difficulty urinating.  Endocrine: Denies: hot or cold intolerance, sweats, changes in hair or nails, polyuria, polydipsia. Musculoskeletal: Denies myalgias, back pain, joint swelling,  arthralgias and gait problem.  Skin: Denies pallor, rash and wound.  Neurological: Denies dizziness, seizures, syncope, weakness, light-headedness, numbness and headaches.  Hematological: Denies adenopathy. Easy bruising, personal or family bleeding history  Psychiatric/Behavioral: Denies suicidal ideation, mood changes, confusion, nervousness, sleep disturbance and agitation    Physical Exam: Vitals:   01/11/21 1341  BP: 102/70   Pulse: 64  Temp: 97.9 F (36.6 C)  TempSrc: Oral  SpO2: 99%  Weight: 141 lb 8 oz (64.2 kg)  Height: _0  (1.626 m)    Body mass index is 24.29 kg/m.   Constitutional: NAD, calm, comfortable Eyes: PERRL, lids and conjunctivae normal ENMT: Mucous membranes are moist. Posterior pharynx clear of any exudate or lesions. Normal dentition. Tympanic membrane is pearly white, no erythema or bulging. Neck: normal, supple, no masses, no thyromegaly Respiratory: clear to auscultation bilaterally, no wheezing, no crackles. Normal respiratory effort. No accessory muscle use.  Cardiovascular: Regular rate and rhythm, no murmurs / rubs / gallops. No extremity edema. 2+ pedal pulses. No carotid bruits.  Abdomen: no tenderness, no masses palpated. No hepatosplenomegaly. Bowel sounds positive.  Musculoskeletal: no clubbing / cyanosis. No joint deformity upper and lower extremities. Good ROM, no contractures. Normal muscle tone.  Skin: no rashes, lesions, ulcers. No induration Neurologic: CN 2-12 grossly intact. Sensation intact, DTR normal. Strength 5/5 in all 4.  Psychiatric: Normal judgment and insight. Alert and oriented x 3. Normal mood.    Impression and Plan:  Encounter for preventive health examination -She has routine eye and dental care. -She will get her second shingles vaccine today in office.  She has been advised to get her fourth COVID booster at the pharmacy.  Otherwise immunizations are up-to-date and age-appropriate. -Healthy lifestyle discussed in detail. -She will return fasting for annual labs. -Colonoscopy in 2022, 10-year callback. -She will be due for mammogram again in October. -She had a Pap smear recently with GYN, redo in 2024.   Patient Instructions   -Nice seeing you today!!  -Lab work today; will notify you once results are available.  -Second shingles vaccine today.  -Remember to get your COVID booster.  -Schedule follow up in 1 year or sooner as  needed.   Preventive Care 70-56 Years Old, Female Preventive care refers to lifestyle choices and visits with your health care provider that can promote health and wellness. This includes:  A yearly physical exam. This is also called an annual wellness visit.  Regular dental and eye exams.  Immunizations.  Screening for certain conditions.  Healthy lifestyle choices, such as: ? Eating a healthy diet. ? Getting regular exercise. ? Not using drugs or products that contain nicotine and tobacco. ? Limiting alcohol use. What can I expect for my preventive care visit? Physical exam Your health care provider will check your:  Height and weight. These may be used to calculate your BMI (body mass index). BMI is a measurement that tells if you are at a healthy weight.  Heart rate and blood pressure.  Body temperature.  Skin for abnormal spots. Counseling Your health care provider may ask you questions about your:  Past medical problems.  Family's medical history.  Alcohol, tobacco, and drug use.  Emotional well-being.  Home life and relationship well-being.  Sexual activity.  Diet, exercise, and sleep habits.  Work and work Statistician.  Access to firearms.  Method of birth control.  Menstrual cycle.  Pregnancy history. What immunizations do I need? Vaccines are usually given at various ages, according to a  schedule. Your health care provider will recommend vaccines for you based on your age, medical history, and lifestyle or other factors, such as travel or where you work.   What tests do I need? Blood tests  Lipid and cholesterol levels. These may be checked every 5 years, or more often if you are over 77 years old.  Hepatitis C test.  Hepatitis B test. Screening  Lung cancer screening. You may have this screening every year starting at age 28 if you have a 30-pack-year history of smoking and currently smoke or have quit within the past 15  years.  Colorectal cancer screening. ? All adults should have this screening starting at age 28 and continuing until age 9. ? Your health care provider may recommend screening at age 32 if you are at increased risk. ? You will have tests every 1-10 years, depending on your results and the type of screening test.  Diabetes screening. ? This is done by checking your blood sugar (glucose) after you have not eaten for a while (fasting). ? You may have this done every 1-3 years.  Mammogram. ? This may be done every 1-2 years. ? Talk with your health care provider about when you should start having regular mammograms. This may depend on whether you have a family history of breast cancer.  BRCA-related cancer screening. This may be done if you have a family history of breast, ovarian, tubal, or peritoneal cancers.  Pelvic exam and Pap test. ? This may be done every 3 years starting at age 40. ? Starting at age 52, this may be done every 5 years if you have a Pap test in combination with an HPV test. Other tests  STD (sexually transmitted disease) testing, if you are at risk.  Bone density scan. This is done to screen for osteoporosis. You may have this scan if you are at high risk for osteoporosis. Talk with your health care provider about your test results, treatment options, and if necessary, the need for more tests. Follow these instructions at home: Eating and drinking  Eat a diet that includes fresh fruits and vegetables, whole grains, lean protein, and low-fat dairy products.  Take vitamin and mineral supplements as recommended by your health care provider.  Do not drink alcohol if: ? Your health care provider tells you not to drink. ? You are pregnant, may be pregnant, or are planning to become pregnant.  If you drink alcohol: ? Limit how much you have to 0-1 drink a day. ? Be aware of how much alcohol is in your drink. In the [place] one drink equals one 12 oz bottle of beer  (355 mL), one 5 oz glass of wine (148 mL), or one 1 oz glass of hard liquor (44 mL).   Lifestyle  Take daily care of your teeth and gums. Brush your teeth every morning and night with fluoride toothpaste. Floss one time each day.  Stay active. Exercise for at least 30 minutes 5 or more days each week.  Do not use any products that contain nicotine or tobacco, such as cigarettes, e-cigarettes, and chewing tobacco. If you need help quitting, ask your health care provider.  Do not use drugs.  If you are sexually active, practice safe sex. Use a condom or other form of protection to prevent STIs (sexually transmitted infections).  If you do not wish to become pregnant, use a form of birth control. If you plan to become pregnant, see your health care provider for  a prepregnancy visit.  If told by your health care provider, take low-dose aspirin daily starting at age 62.  Find healthy ways to cope with stress, such as: ? Meditation, yoga, or listening to music. ? Journaling. ? Talking to a trusted person. ? Spending time with friends and family. Safety  Always wear your seat belt while driving or riding in a vehicle.  Do not drive: ? If you have been drinking alcohol. Do not ride with someone who has been drinking. ? When you are tired or distracted. ? While texting.  Wear a helmet and other protective equipment during sports activities.  If you have firearms in your house, make sure you follow all gun safety procedures. What's next?  Visit your health care provider once a year for an annual wellness visit.  Ask your health care provider how often you should have your eyes and teeth checked.  Stay up to date on all vaccines. This information is not intended to replace advice given to you by your health care provider. Make sure you discuss any questions you have with your health care provider. Document Revised: 06/14/2020 Document Reviewed: 05/22/2018 Elsevier Patient Education   2021 [place], MD [facility]

## 2021-01-11 NOTE — Addendum Note (Signed)
Addended by: Kern Reap B on: 01/11/2021 05:07 PM   Modules accepted: Orders

## 2021-01-11 NOTE — Patient Instructions (Signed)
-Nice seeing you today!!  -Lab work today; will notify you once results are available.  -Second shingles vaccine today.  -Remember to get your COVID booster.  -Schedule follow up in 1 year or sooner as needed.   Preventive Care 90-55 Years Old, Female Preventive care refers to lifestyle choices and visits with your health care provider that can promote health and wellness. This includes:  A yearly physical exam. This is also called an annual wellness visit.  Regular dental and eye exams.  Immunizations.  Screening for certain conditions.  Healthy lifestyle choices, such as: ? Eating a healthy diet. ? Getting regular exercise. ? Not using drugs or products that contain nicotine and tobacco. ? Limiting alcohol use. What can I expect for my preventive care visit? Physical exam Your health care provider will check your:  Height and weight. These may be used to calculate your BMI (body mass index). BMI is a measurement that tells if you are at a healthy weight.  Heart rate and blood pressure.  Body temperature.  Skin for abnormal spots. Counseling Your health care provider may ask you questions about your:  Past medical problems.  Family's medical history.  Alcohol, tobacco, and drug use.  Emotional well-being.  Home life and relationship well-being.  Sexual activity.  Diet, exercise, and sleep habits.  Work and work Statistician.  Access to firearms.  Method of birth control.  Menstrual cycle.  Pregnancy history. What immunizations do I need? Vaccines are usually given at various ages, according to a schedule. Your health care provider will recommend vaccines for you based on your age, medical history, and lifestyle or other factors, such as travel or where you work.   What tests do I need? Blood tests  Lipid and cholesterol levels. These may be checked every 5 years, or more often if you are over 54 years old.  Hepatitis C test.  Hepatitis B  test. Screening  Lung cancer screening. You may have this screening every year starting at age 35 if you have a 30-pack-year history of smoking and currently smoke or have quit within the past 15 years.  Colorectal cancer screening. ? All adults should have this screening starting at age 34 and continuing until age 74. ? Your health care provider may recommend screening at age 45 if you are at increased risk. ? You will have tests every 1-10 years, depending on your results and the type of screening test.  Diabetes screening. ? This is done by checking your blood sugar (glucose) after you have not eaten for a while (fasting). ? You may have this done every 1-3 years.  Mammogram. ? This may be done every 1-2 years. ? Talk with your health care provider about when you should start having regular mammograms. This may depend on whether you have a family history of breast cancer.  BRCA-related cancer screening. This may be done if you have a family history of breast, ovarian, tubal, or peritoneal cancers.  Pelvic exam and Pap test. ? This may be done every 3 years starting at age 33. ? Starting at age 17, this may be done every 5 years if you have a Pap test in combination with an HPV test. Other tests  STD (sexually transmitted disease) testing, if you are at risk.  Bone density scan. This is done to screen for osteoporosis. You may have this scan if you are at high risk for osteoporosis. Talk with your health care provider about your test results, treatment options,  and if necessary, the need for more tests. Follow these instructions at home: Eating and drinking  Eat a diet that includes fresh fruits and vegetables, whole grains, lean protein, and low-fat dairy products.  Take vitamin and mineral supplements as recommended by your health care provider.  Do not drink alcohol if: ? Your health care provider tells you not to drink. ? You are pregnant, may be pregnant, or are planning  to become pregnant.  If you drink alcohol: ? Limit how much you have to 0-1 drink a day. ? Be aware of how much alcohol is in your drink. In the [place] one drink equals one 12 oz bottle of beer (355 mL), one 5 oz glass of wine (148 mL), or one 1 oz glass of hard liquor (44 mL).   Lifestyle  Take daily care of your teeth and gums. Brush your teeth every morning and night with fluoride toothpaste. Floss one time each day.  Stay active. Exercise for at least 30 minutes 5 or more days each week.  Do not use any products that contain nicotine or tobacco, such as cigarettes, e-cigarettes, and chewing tobacco. If you need help quitting, ask your health care provider.  Do not use drugs.  If you are sexually active, practice safe sex. Use a condom or other form of protection to prevent STIs (sexually transmitted infections).  If you do not wish to become pregnant, use a form of birth control. If you plan to become pregnant, see your health care provider for a prepregnancy visit.  If told by your health care provider, take low-dose aspirin daily starting at age 40.  Find healthy ways to cope with stress, such as: ? Meditation, yoga, or listening to music. ? Journaling. ? Talking to a trusted person. ? Spending time with friends and family. Safety  Always wear your seat belt while driving or riding in a vehicle.  Do not drive: ? If you have been drinking alcohol. Do not ride with someone who has been drinking. ? When you are tired or distracted. ? While texting.  Wear a helmet and other protective equipment during sports activities.  If you have firearms in your house, make sure you follow all gun safety procedures. What's next?  Visit your health care provider once a year for an annual wellness visit.  Ask your health care provider how often you should have your eyes and teeth checked.  Stay up to date on all vaccines. This information is not intended to replace advice given to you  by your health care provider. Make sure you discuss any questions you have with your health care provider. Document Revised: 06/14/2020 Document Reviewed: 05/22/2018 Elsevier Patient Education  2021 [employer].

## 2021-01-16 ENCOUNTER — Other Ambulatory Visit: Payer: Self-pay

## 2021-01-16 ENCOUNTER — Other Ambulatory Visit (INDEPENDENT_AMBULATORY_CARE_PROVIDER_SITE_OTHER): Payer: BC Managed Care – PPO

## 2021-01-16 DIAGNOSIS — Z0000 Encounter for general adult medical examination without abnormal findings: Secondary | ICD-10-CM

## 2021-01-16 LAB — HEPATIC FUNCTION PANEL
ALT: 13 U/L (ref 0–35)
AST: 13 U/L (ref 0–37)
Albumin: 3.9 g/dL (ref 3.5–5.2)
Alkaline Phosphatase: 44 U/L (ref 39–117)
Bilirubin, Direct: 0.1 mg/dL (ref 0.0–0.3)
Total Bilirubin: 0.3 mg/dL (ref 0.2–1.2)
Total Protein: 6.8 g/dL (ref 6.0–8.3)

## 2021-01-16 LAB — BASIC METABOLIC PANEL
BUN: 19 mg/dL (ref 6–23)
CO2: 25 mEq/L (ref 19–32)
Calcium: 9.1 mg/dL (ref 8.4–10.5)
Chloride: 105 mEq/L (ref 96–112)
Creatinine, Ser: 0.7 mg/dL (ref 0.40–1.20)
GFR: 97.88 mL/min (ref >60.00)
Glucose, Bld: 86 mg/dL (ref 70–99)
Potassium: 4.3 mEq/L (ref 3.5–5.1)
Sodium: 140 mEq/L (ref 135–145)

## 2021-01-16 LAB — CBC WITH DIFFERENTIAL/PLATELET
Basophils Absolute: 0 10*3/uL (ref 0.0–0.1)
Basophils Relative: 0.7 % (ref 0.0–3.0)
Eosinophils Absolute: 0.2 10*3/uL (ref 0.0–0.7)
Eosinophils Relative: 2.6 % (ref 0.0–5.0)
HCT: 40.1 % (ref 36.0–46.0)
Hemoglobin: 12.7 g/dL (ref 12.0–15.0)
Lymphocytes Relative: 34.6 % (ref 12.0–46.0)
Lymphs Abs: 2.1 10*3/uL (ref 0.7–4.0)
MCHC: 31.7 g/dL (ref 30.0–36.0)
MCV: 70.7 fl — ABNORMAL LOW (ref 78.0–100.0)
Monocytes Absolute: 0.6 10*3/uL (ref 0.1–1.0)
Monocytes Relative: 9.1 % (ref 3.0–12.0)
Neutro Abs: 3.2 10*3/uL (ref 1.4–7.7)
Neutrophils Relative %: 53 % (ref 43.0–77.0)
Platelets: 251 10*3/uL (ref 150.0–400.0)
RBC: 5.68 Mil/uL — ABNORMAL HIGH (ref 3.87–5.11)
RDW: 14.7 % (ref 11.5–15.5)
WBC: 6.1 10*3/uL (ref 4.0–10.5)

## 2021-01-16 LAB — TSH: TSH: 10.62 u[IU]/mL — ABNORMAL HIGH (ref 0.35–4.50)

## 2021-01-16 LAB — VITAMIN D 25 HYDROXY (VIT D DEFICIENCY, FRACTURES): VITD: 20.88 ng/mL — ABNORMAL LOW (ref 30.00–100.00)

## 2021-01-17 LAB — LIPID PANEL
Cholesterol: 172 mg/dL (ref 0–200)
HDL: 48 mg/dL (ref >39.00)
LDL Cholesterol: 103 mg/dL — ABNORMAL HIGH (ref 0–99)
NonHDL: 124.16
Total CHOL/HDL Ratio: 4
Triglycerides: 107 mg/dL (ref 0.0–149.0)
VLDL: 21.4 mg/dL (ref 0.0–40.0)

## 2021-01-25 ENCOUNTER — Telehealth: Payer: Self-pay | Admitting: Internal Medicine

## 2021-01-25 NOTE — Telephone Encounter (Signed)
Pt is calling in stating that she would like to have her lab results.  Pt is aware that they have not been resulted and when they have someone from the office will give her a call.

## 2021-01-26 ENCOUNTER — Other Ambulatory Visit: Payer: Self-pay | Admitting: Internal Medicine

## 2021-01-26 ENCOUNTER — Encounter: Payer: Self-pay | Admitting: Internal Medicine

## 2021-01-26 DIAGNOSIS — E039 Hypothyroidism, unspecified: Secondary | ICD-10-CM

## 2021-01-26 DIAGNOSIS — R718 Other abnormality of red blood cells: Secondary | ICD-10-CM

## 2021-01-26 DIAGNOSIS — E559 Vitamin D deficiency, unspecified: Secondary | ICD-10-CM

## 2021-01-26 MED ORDER — IRON (FERROUS SULFATE) 325 (65 FE) MG PO TABS: 325.0000 mg | ORAL_TABLET | Freq: Every day | ORAL | Status: DC

## 2021-01-26 MED ORDER — VITAMIN D (ERGOCALCIFEROL) 1.25 MG (50000 UNIT) PO CAPS: 50000.0000 [IU] | ORAL_CAPSULE | ORAL | 0 refills | Status: DC

## 2021-01-26 MED ORDER — LEVOTHYROXINE SODIUM 25 MCG PO TABS
25.0000 ug | ORAL_TABLET | Freq: Every day | ORAL | 1 refills | Status: DC
Start: 2021-01-26 — End: 2021-02-02

## 2021-01-27 ENCOUNTER — Other Ambulatory Visit: Payer: Self-pay | Admitting: Internal Medicine

## 2021-01-27 DIAGNOSIS — E559 Vitamin D deficiency, unspecified: Secondary | ICD-10-CM

## 2021-01-27 DIAGNOSIS — E039 Hypothyroidism, unspecified: Secondary | ICD-10-CM

## 2021-01-27 NOTE — Telephone Encounter (Signed)
Patient is aware 

## 2021-01-30 ENCOUNTER — Other Ambulatory Visit: Payer: Self-pay

## 2021-01-30 ENCOUNTER — Telehealth: Payer: Self-pay | Admitting: Internal Medicine

## 2021-01-30 ENCOUNTER — Other Ambulatory Visit (INDEPENDENT_AMBULATORY_CARE_PROVIDER_SITE_OTHER): Payer: BC Managed Care – PPO

## 2021-01-30 DIAGNOSIS — E559 Vitamin D deficiency, unspecified: Secondary | ICD-10-CM | POA: Diagnosis not present

## 2021-01-30 DIAGNOSIS — E039 Hypothyroidism, unspecified: Secondary | ICD-10-CM | POA: Diagnosis not present

## 2021-01-30 LAB — T4, FREE: Free T4: 0.68 ng/dL (ref 0.60–1.60)

## 2021-01-30 LAB — TSH: TSH: 5.61 u[IU]/mL — ABNORMAL HIGH (ref 0.35–4.50)

## 2021-01-30 LAB — T3, FREE: T3, Free: 3.4 pg/mL (ref 2.3–4.2)

## 2021-01-30 LAB — VITAMIN D 25 HYDROXY (VIT D DEFICIENCY, FRACTURES): VITD: 24.4 ng/mL — ABNORMAL LOW (ref 30.00–100.00)

## 2021-01-30 NOTE — Telephone Encounter (Signed)
Patient states she was called and told to come back.  She thinks it is in 1 mth and in 6 mths, but she can't remember.  She is requesting a call to make sure if she needs to come back at these times.

## 2021-02-01 ENCOUNTER — Emergency Department (HOSPITAL_COMMUNITY): Payer: BC Managed Care – PPO

## 2021-02-01 ENCOUNTER — Encounter (HOSPITAL_COMMUNITY): Payer: Self-pay | Admitting: Emergency Medicine

## 2021-02-01 ENCOUNTER — Emergency Department (HOSPITAL_COMMUNITY)
Admission: EM | Admit: 2021-02-01 | Discharge: 2021-02-02 | Disposition: A | Discharge status: Home / Self Care | Payer: BC Managed Care – PPO | Attending: Emergency Medicine | Admitting: Emergency Medicine

## 2021-02-01 ENCOUNTER — Other Ambulatory Visit: Payer: Self-pay

## 2021-02-01 DIAGNOSIS — H539 Unspecified visual disturbance: Secondary | ICD-10-CM | POA: Diagnosis not present

## 2021-02-01 DIAGNOSIS — R42 Dizziness and giddiness: Secondary | ICD-10-CM | POA: Diagnosis not present

## 2021-02-01 DIAGNOSIS — H9319 Tinnitus, unspecified ear: Secondary | ICD-10-CM | POA: Insufficient documentation

## 2021-02-01 DIAGNOSIS — R519 Headache, unspecified: Secondary | ICD-10-CM | POA: Diagnosis not present

## 2021-02-01 DIAGNOSIS — G935 Compression of brain: Secondary | ICD-10-CM

## 2021-02-01 MED ORDER — PROCHLORPERAZINE EDISYLATE 10 MG/2ML IJ SOLN
10.0000 mg | Freq: Once | INTRAMUSCULAR | Status: AC
  Administered 2021-02-01: 10 mg via INTRAVENOUS
  Filled 2021-02-01: qty 2

## 2021-02-01 MED ORDER — SODIUM CHLORIDE 0.9 % IV BOLUS
1000.0000 mL | Freq: Once | INTRAVENOUS | Status: AC
  Administered 2021-02-01: 1000 mL via INTRAVENOUS

## 2021-02-01 MED ORDER — MECLIZINE HCL 25 MG PO TABS
25.0000 mg | ORAL_TABLET | Freq: Once | ORAL | Status: AC
  Administered 2021-02-01: 25 mg via ORAL
  Filled 2021-02-01: qty 1

## 2021-02-01 NOTE — Telephone Encounter (Signed)
Waiting for patient's arrival.

## 2021-02-01 NOTE — ED Triage Notes (Signed)
Pt c/o headache pressure and dizziness with movement that started yesterday and fatigue. Denies chest pain/shortness of breath. A&O x 4, ambulatory without difficulty.

## 2021-02-01 NOTE — ED Provider Notes (Signed)
  Provider Note MRN:  [id number]  Arrival date & time: 02/02/21    ED Course and Medical Decision Making  Assumed care from Dr. Rush Landmark at shift change.  Dizziness, awaiting MRI, if negative would be appropriate for discharge.  MRI is without acute stroke.  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  Discussed with Dr. Amada Jupiter, unlikely to be causing patient's symptoms, was present in the 2010 CT head.  Will refer to neurology, appropriate for discharge.  Procedures  Final Clinical Impressions(s) / ED Diagnoses     ICD-10-CM   1. Dizziness  R42   2. Acute nonintractable headache, unspecified headache type  R51.9   3. 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  Your exam/testing today was overall reassuring.  Symptoms are likely due to an inner ear ear issue and will likely get better with time.  You can use the meclizine medication as needed for dizziness.  The MRI of your brain was reassuring.  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  This malformation is not dangerous and likely will not require any intervention.  Recommend follow-up with the neurologist to discuss your symptoms and your MRI.  Please return to the Emergency Department if you experience any worsening of your condition.  Thank you for allowing [place] to be a part of your care.      Elmer Sow. Pilar Plate, MD [facility] Emergency Medicine [facility] [email or web address]    Sabas Sous,  MD 02/02/21 [phone]

## 2021-02-01 NOTE — Telephone Encounter (Signed)
Pt is calling in stating that she is feeling dizzy and pressure in her head x 2 days.  Pt would like to have her lab results from 01/29/2021 so she can know if the medication is what is making her feel like she does.  Pt is aware that her provider has not received her results and when they do someone from the office will give her a call.  Pt was transferred to the Triage Nurse for evaluation.  Pt would like to have a call back.

## 2021-02-01 NOTE — Telephone Encounter (Signed)
Pt called back and is on her way to the ED to be seen from the advise of the Triage Nurse.

## 2021-02-01 NOTE — ED Notes (Signed)
Pt ambulatory to bathroom and back - fluids restarted

## 2021-02-01 NOTE — Telephone Encounter (Signed)
Patient has arrived at the ED.

## 2021-02-01 NOTE — ED Provider Notes (Signed)
Emergency Medicine Provider Triage Evaluation Note  [name] 55 y.o. female was evaluated in triage.  Pt complains of headache and dizziness that began yesterday.  She states that it is intermittent and only really occurs when she moves.  She states yesterday it was worse.  Symptoms have improved today but she was still concerned that she saw her doctor.  Her PCP sent her over here for further evaluation.  She states that symptoms really only provoked with movement and turning her head.  She has had some fatigue.  She is a [place].  No preceding trauma, injury, fall.  No blood thinner use.  Denies any chest pain, difficulty breathing.   Review of Systems  Positive: Dizziness, headache, fatigue Negative: Chest pain, difficulty breathing, nausea/vomiting, numbness/weakness of arms or legs  Physical Exam  BP 134/82   Pulse 70   Temp 98.2 F (36.8 C) (Oral)   Resp 18   Ht 5\' 4"  (1.626 m)   Wt 65.8 kg   SpO2 100%   BMI 24.89 kg/m  Gen:   Awake, no distress   HEENT:  Atraumatic  Resp:  Normal effort  Cardiac:  Normal rate  Abd:   Nondistended, nontender  MSK:   Moves extremities without difficulty Neuro:  Speech clear.  Cranial nerves III through XII intact.  5/5 strength of bilateral upper and lower extremities.  No facial droop.  Other:   N/A   Medical Decision Making  Medically screening exam initiated at 4:07 PM  Appropriate orders placed.  Abigal Choung was informed that the remainder of the evaluation will be completed by another provider, this initial triage assessment does not replace that evaluation, and the importance of remaining in the ED until their evaluation is complete.   Clinical Impression  HA, Dizziness   Portions of this note were generated with Dragon dictation software. Dictation errors may occur despite best attempts at proofreading.     Archie Patten, PA-C 02/01/21 1608    04/03/21, MD 02/01/21 [phone]

## 2021-02-01 NOTE — ED Provider Notes (Signed)
[facility] EMERGENCY DEPARTMENT Provider Note   CSN: [id number] Arrival date & time: 02/01/21  1554     History Chief Complaint  Patient presents with  . Dizziness    [name] is a 55 y.o. female.  The history is provided by the patient and medical records. No language interpreter was used.  Dizziness Quality:  Head spinning, room spinning and imbalance Severity:  Severe Onset quality:  Gradual Duration:  2 days Timing:  Constant Progression:  Waxing and waning Chronicity:  New Context: head movement and standing up   Relieved by:  Being still Worsened by:  Movement Ineffective treatments:  None tried Associated symptoms: headaches, tinnitus (chronic) and vision changes   Associated symptoms: no chest pain, no diarrhea, no nausea, no palpitations, no shortness of breath, no syncope, no vomiting and no weakness   Risk factors: no hx of stroke and no hx of vertigo        Past Medical History:  Diagnosis Date  . Dizziness 01/2020  . Endometriosis 1990  . Hyperglycemia   . Jejunitis   . Ringing in ears, bilateral 01/2020  . Small bowel obstruction (HCC) 05/13/2020    Patient Active Problem List   Diagnosis Date Noted  . Vitamin D deficiency 01/26/2021  . Hypothyroidism 01/26/2021  . Microcytic erythrocytes 10/13/2020  . Colon cancer screening 09/19/2020  . SBO (small bowel obstruction) (HCC) 05/13/2020  . Ringing in ear, bilateral 02/03/2020  . Dizziness 02/03/2020  . Hyperglycemia 08/02/2019    Past Surgical History:  Procedure Laterality Date  . ABDOMINAL HYSTERECTOMY  1995   endometriosis  . APPENDECTOMY  1995   at time of TAH for endometriosis  . SMALL INTESTINE SURGERY  1998   ex lap, SB resection for SBO     OB History    Gravida  0   Para  0   Term  0   Preterm  0   AB  0   Living  0     SAB  0   IAB  0   Ectopic  0   Multiple  0   Live Births  0           Family History  Problem Relation Age  of Onset  . Cancer Mother        liver cancer   . Cancer Sister     Social History   Tobacco Use  . Smoking status: Never Smoker  . Smokeless tobacco: Never Used  Vaping Use  . Vaping Use: Never used  Substance Use Topics  . Alcohol use: Not Currently  . Drug use: Never    Home Medications Prior to Admission medications   Medication Sig Start Date End Date Taking? Authorizing Provider  ibuprofen (ADVIL) 600 MG tablet Take 1 tablet (600 mg total) by mouth 3 (three) times daily. 12/20/20   Meredith Pel, NP  Iron, Ferrous Sulfate, 325 (65 Fe) MG TABS Take 325 mg by mouth daily. 01/26/21   Philip Aspen, Limmie Patricia, MD  levothyroxine (SYNTHROID) 25 MCG tablet Take 1 tablet (25 mcg total) by mouth daily. 01/26/21   Philip Aspen, Limmie Patricia, MD  methocarbamol (ROBAXIN-750) 750 MG tablet Take 1 tablet (750 mg total) by mouth at bedtime. Patient not taking: Reported on 01/11/2021 12/20/20   Meredith Pel, NP  Multiple Vitamin (MULTIVITAMIN) tablet Take 1 tablet by mouth daily.    [provider]  Vitamin D, Ergocalciferol, (DRISDOL) 1.25 MG (50000 UNIT) CAPS capsule  Take 1 capsule by mouth once a week 01/27/21   Philip Aspen, Limmie Patricia, MD  vitamin E 600 UNIT capsule Take 600 Units by mouth daily.    [provider]    Allergies    Anaprox [naproxen]  Review of Systems   Review of Systems  Constitutional: Negative for chills, diaphoresis, fatigue and fever.  HENT: Positive for tinnitus (chronic). Negative for congestion.   Eyes: Positive for visual disturbance. Negative for photophobia and pain.  Respiratory: Negative for cough, chest tightness and shortness of breath.   Cardiovascular: Negative for chest pain, palpitations and syncope.  Gastrointestinal: Negative for abdominal pain, diarrhea, nausea and vomiting.  Genitourinary: Negative for flank pain.  Musculoskeletal: Negative for back pain, neck pain and neck stiffness.  Skin: Negative for wound.   Neurological: Positive for dizziness and headaches. Negative for tremors, seizures, facial asymmetry, weakness, light-headedness and numbness.  Psychiatric/Behavioral: Negative for agitation and confusion.  All other systems reviewed and are negative.   Physical Exam Updated Vital Signs BP (!) 129/59 (BP Location: Right Arm)   Pulse 87   Temp 98.8 F (37.1 C)   Resp 15   SpO2 100%   Physical Exam Vitals and nursing note reviewed.  Constitutional:      General: She is not in acute distress.    Appearance: She is well-developed. She is not ill-appearing, toxic-appearing or diaphoretic.  HENT:     Head: Normocephalic and atraumatic.     Nose: Nose normal. No congestion or rhinorrhea.     Mouth/Throat:     Mouth: Mucous membranes are moist.     Pharynx: No oropharyngeal exudate or posterior oropharyngeal erythema.  Eyes:     Extraocular Movements: Extraocular movements intact.     Conjunctiva/sclera: Conjunctivae normal.     Pupils: Pupils are equal, round, and reactive to light.  Cardiovascular:     Rate and Rhythm: Normal rate and regular rhythm.     Heart sounds: No murmur heard.   Pulmonary:     Effort: Pulmonary effort is normal. No respiratory distress.     Breath sounds: Normal breath sounds. No wheezing, rhonchi or rales.  Chest:     Chest wall: No tenderness.  Abdominal:     General: Abdomen is flat.     Palpations: Abdomen is soft.     Tenderness: There is no abdominal tenderness. There is no right CVA tenderness, left CVA tenderness, guarding or rebound.  Musculoskeletal:        General: No tenderness.     Cervical back: Neck supple. No tenderness.  Skin:    General: Skin is warm and dry.     Capillary Refill: Capillary refill takes less than 2 seconds.  Neurological:     General: No focal deficit present.     Mental Status: She is alert.     Cranial Nerves: No cranial nerve deficit.     Sensory: No sensory deficit.     Motor: No weakness.      Coordination: Coordination normal.     Gait: Gait abnormal.  Psychiatric:        Mood and Affect: Mood normal.     ED Results / Procedures / Treatments   Labs (all labs ordered are listed, but only abnormal results are displayed) Labs Reviewed - No data to display  EKG EKG Interpretation  Date/Time:  Wednesday Feb 01 2021 16:02:57 EDT Ventricular Rate:  82 PR Interval:  150 QRS Duration: 82 QT Interval:  388 QTC Calculation: 453  R Axis:   88 Text Interpretation: Normal sinus rhythm Normal ECG When compared to prior, similar appearance. No STEMI Confirmed by Theda Belfast ([id number]) on 02/01/2021 6:59:15 PM   Radiology No results found.  Procedures Procedures   Medications Ordered in ED Medications  meclizine (ANTIVERT) tablet 25 mg (has no administration in time range)  sodium chloride 0.9 % bolus 1,000 mL (has no administration in time range)  prochlorperazine (COMPAZINE) injection 10 mg (has no administration in time range)  sodium chloride 0.9 % bolus 1,000 mL (has no administration in time range)    ED Course  I have reviewed the triage vital signs and the nursing notes.  Pertinent labs & imaging results that were available during my care of the patient were reviewed by me and considered in my medical decision making (see chart for details).    MDM Rules/Calculators/A&P                          [name] is a 55 y.o. female with a past medical history significant for chronic tinnitus, prior SBO, hypothyroidism now on Synthroid recently, endometriosis, and vitamin D deficiency who presents for evaluation of new headache, dizziness, unsteadiness, and transient vision changes.  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  She reports that she gets unsteady and has difficulty with walking.  Just to catch herself while ambulating.  This is a new problem for her.  She reports that she did not history of vertigo but  does have a history of headaches in the past.  She reports that this headache is moderate to severe and is all over her head.  She reports no nausea, vomiting, or speech difficulties but does report she is had some mild blurry vision at times that is resolved.  She denies any neck pain or neck stiffness and denies fevers or chills.  Denies any cough, congestion, constipation, diarrhea, or urinary changes.  Denies any trauma.  Denies significant photophobia or phonophobia.  On exam, lungs are clear and chest is nontender.  Abdomen is nontender.  She had no focal neurologic deficits initially with normal sensation and strength in extremities.  Normal finger-nose-finger testing bilaterally.  Pupils are symmetric reactive normal extraocular movements without significant nystagmus.  When she sat up and stood up, she did get dizzy and unsteady.  I try to ambulate her she did have some mild difficulty stumbling.  She did not actually fall however.  Exam otherwise unremarkable.   Clinical aspect she has vertigo however with the reported transient vision changes, her unsteadiness, and this being new, we will get an MRI as this is what her PCP told her to get done.  We will also treat her with meclizine and headache cocktail as well.  Anticipate discharge if work-up is reassuring after management.  Care transferred to oncoming team awaiting for results of MRI and reassessment.   Final Clinical Impression(s) / ED Diagnoses Final diagnoses:  Dizziness  Acute nonintractable headache, unspecified headache type    Clinical Impression: 1. Dizziness   2. Acute nonintractable headache, unspecified headache type     Disposition: Care transferred to oncoming team awaiting for results of MRI and reassessment.  This note was prepared with assistance of Conservation officer, historic buildings. Occasional wrong-word or sound-a-like substitutions may have occurred due to the inherent limitations of voice recognition  software.      Azarion Hove, Canary Brim, MD 02/01/21 [phone]

## 2021-02-02 ENCOUNTER — Other Ambulatory Visit: Payer: Self-pay | Admitting: Internal Medicine

## 2021-02-02 DIAGNOSIS — E039 Hypothyroidism, unspecified: Secondary | ICD-10-CM

## 2021-02-02 DIAGNOSIS — E559 Vitamin D deficiency, unspecified: Secondary | ICD-10-CM

## 2021-02-02 MED ORDER — LEVOTHYROXINE SODIUM 50 MCG PO TABS
50.0000 ug | ORAL_TABLET | Freq: Every day | ORAL | 1 refills | Status: DC
Start: 2021-02-02 — End: 2021-04-20

## 2021-02-02 MED ORDER — MECLIZINE HCL 25 MG PO TABS: 25.0000 mg | ORAL_TABLET | Freq: Three times a day (TID) | ORAL | 0 refills | Status: DC | PRN

## 2021-02-02 NOTE — Discharge Instructions (Addendum)
You were evaluated in the Emergency Department and after careful evaluation, we did not find any emergent condition requiring admission or further testing in the hospital.  Your exam/testing today was overall reassuring.  Symptoms are likely due to an inner ear ear issue and will likely get better with time.  You can use the meclizine medication as needed for dizziness.  The MRI of your brain was reassuring.  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  This malformation is not dangerous and likely will not require any intervention.  Recommend follow-up with the neurologist to discuss your symptoms and your MRI.  Please return to the Emergency Department if you experience any worsening of your condition.  Thank you for allowing [place] to be a part of your care.

## 2021-02-02 NOTE — Telephone Encounter (Signed)
Reviewed lab results with patient.

## 2021-02-02 NOTE — ED Notes (Signed)
Patient verbalizes understanding of discharge instructions. Opportunity for questioning and answers were provided. Armband removed by staff, pt discharged from ED via wheelchair.  

## 2021-02-02 NOTE — Telephone Encounter (Signed)
Pt called to make an appointment and while on the phone someone called her and she was wondering who it was.  Pt is aware that it was the CMA and I will send a msg back to them.

## 2021-02-07 ENCOUNTER — Other Ambulatory Visit: Payer: Self-pay

## 2021-02-08 ENCOUNTER — Ambulatory Visit: Payer: BC Managed Care – PPO | Admitting: Internal Medicine

## 2021-02-08 ENCOUNTER — Encounter: Payer: Self-pay | Admitting: Internal Medicine

## 2021-02-08 VITALS — BP 120/78 | HR 79 | Temp 98.0°F | Wt 142.4 lb

## 2021-02-08 DIAGNOSIS — G935 Compression of brain: Secondary | ICD-10-CM | POA: Diagnosis not present

## 2021-02-08 DIAGNOSIS — E039 Hypothyroidism, unspecified: Secondary | ICD-10-CM

## 2021-02-08 DIAGNOSIS — Z09 Encounter for follow-up examination after completed treatment for conditions other than malignant neoplasm: Secondary | ICD-10-CM

## 2021-02-08 DIAGNOSIS — R718 Other abnormality of red blood cells: Secondary | ICD-10-CM

## 2021-02-08 NOTE — Progress Notes (Signed)
Established Patient Office Visit     This visit occurred during the SARS-CoV-2 public health emergency.  Safety protocols were in place, including screening questions prior to the visit, additional usage of staff PPE, and extensive cleaning of exam room while observing appropriate contact time as indicated for disinfecting solutions.    CC/Reason for Visit: ED follow-up, questions about medical conditions  HPI: [name] is a 55 y.o. female who is coming in today for the above mentioned reasons. Past Medical History is significant for: Hypothyroidism that was diagnosed in the last few months, iron deficiency on daily iron supplementation.  Emergency department on May 11 due to dizziness.  MRI was negative for acute changes, however she was found to have a Chiari I malformation.  She has been having intermittent headaches and dizziness.  She has already been scheduled to see neurology.  She has several questions:  1.  She is wondering why her thyroid dose needs to be increased again.  2.  She is wondering if she currently needs to take iron supplementation daily.    Past Medical/Surgical History: Past Medical History:  Diagnosis Date  . Dizziness 01/2020  . Endometriosis 1990  . Hyperglycemia   . Jejunitis   . Ringing in ears, bilateral 01/2020  . Small bowel obstruction (HCC) 05/13/2020    Past Surgical History:  Procedure Laterality Date  . ABDOMINAL HYSTERECTOMY  1995   endometriosis  . APPENDECTOMY  1995   at time of TAH for endometriosis  . SMALL INTESTINE SURGERY  1998   ex lap, SB resection for SBO    Social History:  reports that she has never smoked. She has never used smokeless tobacco. She reports previous alcohol use. She reports that she does not use drugs.  Allergies: Allergies  Allergen Reactions  . Anaprox [Naproxen] Swelling and Other (See Comments)    swelling and redness    Family History:  Family History  Problem Relation Age of Onset   . Cancer Mother        liver cancer   . Cancer Sister      Current Outpatient Medications:  .  ibuprofen (ADVIL) 600 MG tablet, Take 1 tablet (600 mg total) by mouth 3 (three) times daily., Disp: 21 tablet, Rfl: 0 .  Iron, Ferrous Sulfate, 325 (65 Fe) MG TABS, Take 325 mg by mouth daily., Disp: 30 tablet, Rfl:  .  levothyroxine (SYNTHROID) 50 MCG tablet, Take 1 tablet (50 mcg total) by mouth daily., Disp: 90 tablet, Rfl: 1 .  meclizine (ANTIVERT) 25 MG tablet, Take 1 tablet (25 mg total) by mouth 3 (three) times daily as needed for dizziness., Disp: 30 tablet, Rfl: 0 .  Multiple Vitamin (MULTIVITAMIN) tablet, Take 1 tablet by mouth daily., Disp: , Rfl:  .  Vitamin D, Ergocalciferol, (DRISDOL) 1.25 MG (50000 UNIT) CAPS capsule, Take 1 capsule by mouth once a week, Disp: 12 capsule, Rfl: 0 .  vitamin E 600 UNIT capsule, Take 600 Units by mouth daily., Disp: , Rfl:  .  methocarbamol (ROBAXIN-750) 750 MG tablet, Take 1 tablet (750 mg total) by mouth at bedtime. (Patient not taking: No sig reported), Disp: 7 tablet, Rfl: 0  Review of Systems:  Constitutional: Denies fever, chills, diaphoresis, appetite change and fatigue.  HEENT: Denies photophobia, eye pain, redness, hearing loss, ear pain, congestion, sore throat, rhinorrhea, sneezing, mouth sores, trouble swallowing, neck pain, neck stiffness and tinnitus.   Respiratory: Denies SOB, DOE, cough, chest tightness,  and wheezing.  Cardiovascular: Denies chest pain, palpitations and leg swelling.  Gastrointestinal: Denies nausea, vomiting, abdominal pain, diarrhea, constipation, blood in stool and abdominal distention.  Genitourinary: Denies dysuria, urgency, frequency, hematuria, flank pain and difficulty urinating.  Endocrine: Denies: hot or cold intolerance, sweats, changes in hair or nails, polyuria, polydipsia. Musculoskeletal: Denies myalgias, back pain, joint swelling, arthralgias and gait problem.  Skin: Denies pallor, rash and wound.   Neurological: Denies dizziness, seizures, syncope, weakness, light-headedness, numbness and headaches.  Hematological: Denies adenopathy. Easy bruising, personal or family bleeding history  Psychiatric/Behavioral: Denies suicidal ideation, mood changes, confusion, nervousness, sleep disturbance and agitation    Physical Exam: Vitals:   02/08/21 0956  BP: 120/78  Pulse: 79  Temp: 98 F (36.7 C)  TempSrc: Oral  SpO2: 98%  Weight: 142 lb 6.4 oz (64.6 kg)    Body mass index is 24.44 kg/m.   Constitutional: NAD, calm, comfortable Eyes: PERRL, lids and conjunctivae normal ENMT: Mucous membranes are moist.  Respiratory: clear to auscultation bilaterally, no wheezing, no crackles. Normal respiratory effort. No accessory muscle use.  Cardiovascular: Regular rate and rhythm, no murmurs / rubs / gallops. No extremity edema Neurologic: Grossly intact and nonfocal Psychiatric: Normal judgment and insight. Alert and oriented x 3. Normal mood.    Impression and Plan:  Hospital discharge follow-up  Hypothyroidism, unspecified type -This is a new diagnosis. -Thyroid feedback loop has been explained. -She understands that as long as TSH remains elevated we will need to increase her thyroid dosing. -She will increase dose to 50 mcg daily and return in 6 weeks for repeat TSH.  Microcytic erythrocytes -She was found to have an MCV of 70 with hemoglobin of 12.7 so not quite yet anemic. -I have advised that she take daily ferrous sulfate for the next 90 days, at that point we can recheck CBC and iron studies to determine if iron stores have been sufficiently repleted. -She already had a colonoscopy in February this year.  Chiari malformation type I (HCC) -Incidental finding on MRI. -She has been referred to neurology and is awaiting appointment.  Time spent: 32 minutes reviewing hospital charts, interviewing and examining patient, answering all questions and formulating plan of  care.    Chaya Jan, MD [facility]

## 2021-03-13 DIAGNOSIS — N644 Mastodynia: Secondary | ICD-10-CM | POA: Diagnosis not present

## 2021-04-14 ENCOUNTER — Other Ambulatory Visit: Payer: Self-pay

## 2021-04-14 ENCOUNTER — Other Ambulatory Visit (INDEPENDENT_AMBULATORY_CARE_PROVIDER_SITE_OTHER): Payer: BC Managed Care – PPO

## 2021-04-14 DIAGNOSIS — E039 Hypothyroidism, unspecified: Secondary | ICD-10-CM | POA: Diagnosis not present

## 2021-04-14 DIAGNOSIS — E559 Vitamin D deficiency, unspecified: Secondary | ICD-10-CM | POA: Diagnosis not present

## 2021-04-14 LAB — VITAMIN D 25 HYDROXY (VIT D DEFICIENCY, FRACTURES): VITD: 40.68 ng/mL (ref 30.00–100.00)

## 2021-04-14 LAB — TSH: TSH: 1.55 u[IU]/mL (ref 0.35–5.50)

## 2021-04-18 DIAGNOSIS — R928 Other abnormal and inconclusive findings on diagnostic imaging of breast: Secondary | ICD-10-CM | POA: Diagnosis not present

## 2021-04-18 DIAGNOSIS — N644 Mastodynia: Secondary | ICD-10-CM | POA: Diagnosis not present

## 2021-04-20 ENCOUNTER — Encounter: Payer: Self-pay | Admitting: Neurology

## 2021-04-20 ENCOUNTER — Ambulatory Visit: Payer: BC Managed Care – PPO | Admitting: Neurology

## 2021-04-20 VITALS — BP 113/69 | HR 82 | Ht 64.0 in | Wt 144.0 lb

## 2021-04-20 DIAGNOSIS — G935 Compression of brain: Secondary | ICD-10-CM

## 2021-04-20 DIAGNOSIS — G44229 Chronic tension-type headache, not intractable: Secondary | ICD-10-CM

## 2021-04-20 NOTE — Patient Instructions (Signed)
Continue your current medication  Continue with Aleve as needed for pain  Return to clinic in a year or sooner if worse

## 2021-04-20 NOTE — Progress Notes (Signed)
[facility]  PATIENT: [name] DOB: [birth year]  REFERRING CLINICIAN: Philip Aspen, Est[place]* HISTORY FROM: patient and friend Rose  REASON FOR VISIT: Headaches    HISTORICAL  CHIEF COMPLAINT:  Chief Complaint  Patient presents with   New Patient (Initial Visit)    Room 12 with friend, Okey Dupre. ED follow from 02/01/21 - headaches/dizziness. Hx of migraines. No further issues with dizziness. Estimates having tenison headaches 1-2 times per month that resolve with Aleve. DSes not feel she has had a true migraine in six years. 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    HISTORY OF PRESENT ILLNESS:  This is a 55 year old woman with past medical history of hypothyroidism who is presenting for evaluation of her headaches.  She reports a long standing history of headaches that started in her 25s.  For headaches, patient stated they usually start on back of her head.  When she gets the headaches usually, they last a couple hours and she takes Aleve's.  Headaches are associated with nausea but no vomiting and also phonophobia.  Patient also states that phonophobia may be related to her chronic tinnitus.  She report that she has severe headache that he describes as migraine and stated in the past 12 years she has had about 1 episode  of severe headaches with inability to walk lasting 2 days that required to go to the hospital.  During that 1 episode she had severe dizziness, nausea associated with a headache.  But now, she described having only mild headache.  On May 11 she had an episode of headaches and dizziness, contacted her primary care doctor who referred her to the ED because of the dizziness.  During evaluation in the ED she had a MRI brain which show a Chiari I malformation.  She was treated with IV fluid meclizine and referred to neurology for further work-up and management   Headache History and  Characteristics: Onset: Started in her 107s  Location: back of head  Quality: Pressure, feels like squeezing my brain.  Intensity: 8/10.  Duration: can last a couple of hours. Migrainous Features: phonophobia Aura: Seeing flashing of light  History of brain injury or tumor: No  Previous Treatment:  Prophylactic: No  Abortive: Aleve Injections: No    Aura:  photophobia/ phonophobia:  nausea/ vomiting: yes   Family history: Sister with migraines  Motion sickness: no Cardiac history: no  OTC: Aleve Caffeine: no Sleep: Poor, cannot stay asleep, goes to bed at 11-12, fall asleep within an hour then wakes up at 630AM Mood/ Stress: Good   Prior prophylaxis: Propranolol: No  Verapamil:No TCA: No Topamax: No Depakote: No Effexor: No Cymbalta: No Neurontin:No  Prior abortives: Triptan: No Anti-emetic: No Steroids: No Ergotamine suppository: No  Prior interventions None   REVIEW OF SYSTEMS: Full 14 system review of systems performed and negative with exception of: as noted in the HPI  ALLERGIES: Allergies  Allergen Reactions   Anaprox [Naproxen] Swelling and Other (See Comments)    swelling and redness    HOME MEDICATIONS: Outpatient Medications Prior to Visit  Medication Sig Dispense Refill   Multiple Vitamin (MULTIVITAMIN) tablet Take 1 tablet by mouth daily.     naproxen sodium (ALEVE) 220 MG tablet Take 220 mg by mouth as needed.     vitamin E 600 UNIT capsule Take 600 Units by mouth daily.     ibuprofen (ADVIL) 600 MG tablet Take 1 tablet (600  mg total) by mouth 3 (three) times daily. 21 tablet 0   Iron, Ferrous Sulfate, 325 (65 Fe) MG TABS Take 325 mg by mouth daily. 30 tablet    levothyroxine (SYNTHROID) 50 MCG tablet Take 1 tablet (50 mcg total) by mouth daily. 90 tablet 1   meclizine (ANTIVERT) 25 MG tablet Take 1 tablet (25 mg total) by mouth 3 (three) times daily as needed for dizziness. 30 tablet 0   methocarbamol (ROBAXIN-750) 750 MG tablet Take 1  tablet (750 mg total) by mouth at bedtime. (Patient not taking: No sig reported) 7 tablet 0   Vitamin D, Ergocalciferol, (DRISDOL) 1.25 MG (50000 UNIT) CAPS capsule Take 1 capsule by mouth once a week 12 capsule 0   No facility-administered medications prior to visit.    PAST MEDICAL HISTORY: Past Medical History:  Diagnosis Date   Chiari I malformation (HCC)    Dizziness 01/2020   Endometriosis 1990   Hyperglycemia    Jejunitis    Ringing in ears, bilateral 01/2020   Small bowel obstruction (HCC) 05/13/2020    PAST SURGICAL HISTORY: Past Surgical History:  Procedure Laterality Date   ABDOMINAL HYSTERECTOMY  1995   endometriosis   APPENDECTOMY  1995   at time of TAH for endometriosis   SMALL INTESTINE SURGERY  1998   ex lap, SB resection for SBO    FAMILY HISTORY: Family History  Problem Relation Age of Onset   Cancer Mother        liver cancer    Diabetes Father    Hypertension Father    Cancer Sister     SOCIAL HISTORY: Social History   Socioeconomic History   Marital status: Married    Spouse name: Not on file   Number of children: 0   Years of education: 11th   Highest education level: Not on file  Occupational History   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   Right-handed.   No daily use of caffeine.   Social Determinants of Health   Financial Resource Strain: Not on file  Food Insecurity: Not on file  Transportation Needs: Not on file  Physical Activity: Not on file  Stress: Not on file  Social Connections: Not on file  Intimate Partner Violence: Not on file     PHYSICAL EXAM  GENERAL EXAM/CONSTITUTIONAL: Vitals:  Vitals:   04/20/21 1330  BP: 113/69  Pulse:  82  Weight: 144 lb (65.3 kg)  Height: 5\' 4"  (1.626 m)   Body mass index is 24.72 kg/m. Wt Readings from Last 3 Encounters:  04/20/21 144 lb (65.3 kg)  02/08/21 142 lb 6.4 oz (64.6 kg)  01/11/21 141 lb 8 oz (64.2 kg)   Patient is in no distress; well developed, nourished and groomed; neck is supple  CARDIOVASCULAR: Examination of carotid arteries is normal; no carotid bruits Regular rate and rhythm, no murmurs Examination of peripheral vascular system by observation and palpation is normal  EYES: Pupils round and reactive to light, Visual fields full to confrontation, Extraocular movements intacts,   MUSCULOSKELETAL: Gait, strength, tone, movements noted in Neurologic exam below  NEUROLOGIC: MENTAL STATUS:  No flowsheet data found. awake, alert,  oriented to person, place and time recent and remote memory intact normal attention and concentration language fluent, comprehension intact, naming intact fund of knowledge appropriate  CRANIAL NERVE:  2nd - no papilledema or hemorrhages on fundoscopic exam 2nd, 3rd, 4th, 6th - pupils equal and reactive to light, visual fields full to confrontation, extraocular muscles intact, no nystagmus 5th - facial sensation symmetric 7th - facial strength symmetric 8th - hearing intact 9th - palate elevates symmetrically, uvula midline 11th - shoulder shrug symmetric 12th - tongue protrusion midline  MOTOR:  normal bulk and tone, full strength in the BUE, BLE  SENSORY:  normal and symmetric to light touch, pinprick, temperature, vibration  COORDINATION:  finger-nose-finger, fine finger movements normal  REFLEXES:  deep tendon reflexes present and symmetric  GAIT/STATION:  normal     DIAGNOSTIC DATA (LABS, IMAGING, TESTING) - I reviewed patient records, labs, notes, testing and imaging myself where available.  Lab Results  Component Value Date   WBC 6.1 01/16/2021   HGB 12.7 01/16/2021   HCT 40.1 01/16/2021   MCV 70.7  (L) 01/16/2021   PLT 251.0 01/16/2021      Component Value Date/Time   NA 140 01/16/2021 0708   NA 144 01/16/2019 1425   K 4.3 01/16/2021 0708   CL 105 01/16/2021 0708   CO2 25 01/16/2021 0708   GLUCOSE 86 01/16/2021 0708   BUN 19 01/16/2021 0708   BUN 16 01/16/2019 1425   CREATININE 0.70 01/16/2021 0708   CREATININE 0.80 10/13/2020 1209   CALCIUM 9.1 01/16/2021 0708   PROT 6.8 01/16/2021 0708   PROT 7.1 01/16/2019 1425   ALBUMIN 3.9 01/16/2021 0708   ALBUMIN 4.7 01/16/2019 1425   AST 13 01/16/2021 0708   AST 12 (L) 10/13/2020 1209   ALT 13 01/16/2021 0708   ALT 12 10/13/2020 1209   ALKPHOS 44 01/16/2021 0708   BILITOT 0.3 01/16/2021 0708   BILITOT 0.5 10/13/2020 1209   GFRNONAA >60 10/13/2020 1209   GFRAA >60 05/19/2020 0514   Lab Results  Component Value Date   CHOL 172 01/16/2021   HDL 48.00 01/16/2021   LDLCALC 103 (H) 01/16/2021   TRIG 107.0 01/16/2021   CHOLHDL 4 01/16/2021   Lab Results  Component Value Date   HGBA1C 5.8 (A) 02/03/2020   Lab Results  Component Value Date   VITAMINB12 224 10/13/2020   Lab Results  Component Value Date   TSH 1.55 04/14/2021    MRI Brain reviewed:  1. No acute intracranial abnormality. 2. Chiari 1 malformation with the cerebellar tonsils extending up to 1 cm below the foramen magnum. 3. Otherwise unremarkable and normal brain MRI for age.    ASSESSMENT AND PLAN  55 y.o. year old female past medical history of hypothyroidism on Synthroid, who is presenting for evaluation of a headache.  Patient states that she is having headaches associated with dizziness and nausea but no vomiting.  She was found to have a Chiari I malformation on MRI.  I spent extensive time discussing with patient and friend Okey Dupre about her diagnosis of Chiari I vaccination.  All questions and concerns addressed.  Recommend patient to continue with Aleve as needed for the headache.  Since her headaches are mild and likely due to Chiari I malformation,   I did not prescribe any preventive medications.  I have recommended patient to take Aleve as needed for the pain and to follow up within a year or sooner if needed for re-evaluation of her symptoms.  1. Chiari malformation type I (HCC)   2. Chronic tension-type headache, not intractable       PLAN: Continue your current medication  Continue with Aleve as needed for pain  Return to clinic in a year or sooner if worse   No orders of the defined types were placed in this encounter.   No orders of the defined types were placed in this encounter.   Return in about 1 year (around 04/20/2022).    Windell NorfolkAmadou Camara, MD 04/20/2021, 2:53 PM  [facility] [address], [address] [place], [place] [phone]

## 2021-04-21 DIAGNOSIS — M7918 Myalgia, other site: Secondary | ICD-10-CM | POA: Diagnosis not present

## 2021-04-21 DIAGNOSIS — M549 Dorsalgia, unspecified: Secondary | ICD-10-CM | POA: Diagnosis not present

## 2021-04-27 ENCOUNTER — Ambulatory Visit: Payer: BC Managed Care – PPO | Admitting: Neurology

## 2021-04-28 ENCOUNTER — Ambulatory Visit: Payer: BC Managed Care – PPO | Admitting: Neurology

## 2021-06-02 DIAGNOSIS — M47814 Spondylosis without myelopathy or radiculopathy, thoracic region: Secondary | ICD-10-CM | POA: Diagnosis not present

## 2021-06-02 DIAGNOSIS — M7918 Myalgia, other site: Secondary | ICD-10-CM | POA: Diagnosis not present

## 2021-06-02 DIAGNOSIS — Z6823 Body mass index (BMI) 23.0-23.9, adult: Secondary | ICD-10-CM | POA: Diagnosis not present

## 2021-06-02 DIAGNOSIS — M549 Dorsalgia, unspecified: Secondary | ICD-10-CM | POA: Diagnosis not present

## 2021-06-22 DIAGNOSIS — R8281 Pyuria: Secondary | ICD-10-CM | POA: Diagnosis not present

## 2021-06-22 DIAGNOSIS — R1032 Left lower quadrant pain: Secondary | ICD-10-CM | POA: Diagnosis not present

## 2021-06-22 DIAGNOSIS — R109 Unspecified abdominal pain: Secondary | ICD-10-CM | POA: Diagnosis not present

## 2021-06-22 DIAGNOSIS — R1013 Epigastric pain: Secondary | ICD-10-CM | POA: Diagnosis not present

## 2021-06-23 ENCOUNTER — Other Ambulatory Visit: Payer: Self-pay

## 2021-06-23 ENCOUNTER — Ambulatory Visit (HOSPITAL_BASED_OUTPATIENT_CLINIC_OR_DEPARTMENT_OTHER)
Admission: RE | Admit: 2021-06-23 | Discharge: 2021-06-23 | Disposition: A | Discharge status: Home / Self Care | Payer: BC Managed Care – PPO | Source: Ambulatory Visit | Attending: Physician Assistant | Admitting: Physician Assistant

## 2021-06-23 ENCOUNTER — Encounter (HOSPITAL_BASED_OUTPATIENT_CLINIC_OR_DEPARTMENT_OTHER): Payer: Self-pay

## 2021-06-23 ENCOUNTER — Other Ambulatory Visit (HOSPITAL_BASED_OUTPATIENT_CLINIC_OR_DEPARTMENT_OTHER): Payer: Self-pay | Admitting: Physician Assistant

## 2021-06-23 DIAGNOSIS — R1032 Left lower quadrant pain: Secondary | ICD-10-CM

## 2021-06-23 DIAGNOSIS — R1110 Vomiting, unspecified: Secondary | ICD-10-CM | POA: Diagnosis not present

## 2021-06-23 DIAGNOSIS — R1013 Epigastric pain: Secondary | ICD-10-CM | POA: Diagnosis not present

## 2021-06-23 DIAGNOSIS — R109 Unspecified abdominal pain: Secondary | ICD-10-CM | POA: Diagnosis not present

## 2021-06-23 DIAGNOSIS — R8281 Pyuria: Secondary | ICD-10-CM | POA: Diagnosis not present

## 2021-06-23 MED ORDER — IOHEXOL 350 MG/ML SOLN
60.0000 mL | Freq: Once | INTRAVENOUS | Status: AC | PRN
  Administered 2021-06-23: 60 mL via INTRAVENOUS

## 2021-06-29 ENCOUNTER — Telehealth: Payer: Self-pay

## 2021-06-29 ENCOUNTER — Other Ambulatory Visit: Payer: Self-pay

## 2021-06-30 ENCOUNTER — Ambulatory Visit: Payer: BC Managed Care – PPO | Admitting: Internal Medicine

## 2021-06-30 ENCOUNTER — Encounter: Payer: Self-pay | Admitting: Internal Medicine

## 2021-06-30 VITALS — BP 110/70 | HR 72 | Temp 98.0°F | Wt 144.9 lb

## 2021-06-30 DIAGNOSIS — I820 Budd-Chiari syndrome: Secondary | ICD-10-CM

## 2021-06-30 DIAGNOSIS — R1013 Epigastric pain: Secondary | ICD-10-CM | POA: Diagnosis not present

## 2021-06-30 DIAGNOSIS — Z09 Encounter for follow-up examination after completed treatment for conditions other than malignant neoplasm: Secondary | ICD-10-CM

## 2021-06-30 MED ORDER — MECLIZINE HCL 25 MG PO TABS: 25.0000 mg | ORAL_TABLET | Freq: Three times a day (TID) | ORAL | 0 refills | Status: DC | PRN

## 2021-06-30 MED ORDER — PANTOPRAZOLE SODIUM 40 MG PO TBEC: 40.0000 mg | DELAYED_RELEASE_TABLET | Freq: Every day | ORAL | 1 refills | Status: DC

## 2021-06-30 NOTE — Progress Notes (Signed)
Established Patient Office Visit     This visit occurred during the SARS-CoV-2 public health emergency.  Safety protocols were in place, including screening questions prior to the visit, additional usage of staff PPE, and extensive cleaning of exam room while observing appropriate contact time as indicated for disinfecting solutions.    CC/Reason for Visit: Urgent care follow-up  HPI: [name] is a 55 y.o. female who is coming in today for the above mentioned reasons.  She has been having chronic abdominal pain now for around 18 months.  Right upper quadrant ultrasound and HIDA scan were negative.  She saw GI in March and was told to return if issues continue to consider an endoscopy.  Over the weekend she had lunch at [facility] and proceeded to have about a 48-hour period of vomiting and diarrhea.  She went to urgent care and it would appear she had a very extensive work-up including CT scan, EKG, lab work although none of this is available to me.  She was told that everything was normal.  She is here today to follow-up.  While acute gastroenteritis seems to have resolved, she continues to have significant epigastric pain.  She is reluctant to try PPI therapy as she was told that it can cause renal dysfunction.  She is requesting a refill of meclizine that neurologist had prescribed her for chronic headache and dizziness possibly related to a Chiari I malformation.  Past Medical/Surgical History: Past Medical History:  Diagnosis Date   Chiari I malformation (HCC)    Dizziness 01/2020   Endometriosis 1990   Hyperglycemia    Jejunitis    Ringing in ears, bilateral 01/2020   Small bowel obstruction (HCC) 05/13/2020    Past Surgical History:  Procedure Laterality Date   ABDOMINAL HYSTERECTOMY  1995   endometriosis   APPENDECTOMY  1995   at time of TAH for endometriosis   SMALL INTESTINE SURGERY  1998   ex lap, SB resection for SBO    Social History:  reports that she has  never smoked. She has never used smokeless tobacco. She reports that she does not currently use alcohol. She reports that she does not use drugs.  Allergies: Allergies  Allergen Reactions   Anaprox [Naproxen] Swelling and Other (See Comments)    swelling and redness    Family History:  Family History  Problem Relation Age of Onset   Cancer Mother        liver cancer    Diabetes Father    Hypertension Father    Cancer Sister      Current Outpatient Medications:    meclizine (ANTIVERT) 25 MG tablet, Take 1 tablet (25 mg total) by mouth 3 (three) times daily as needed for dizziness., Disp: 30 tablet, Rfl: 0   Multiple Vitamin (MULTIVITAMIN) tablet, Take 1 tablet by mouth daily., Disp: , Rfl:    naproxen sodium (ALEVE) 220 MG tablet, Take 220 mg by mouth as needed., Disp: , Rfl:    pantoprazole (PROTONIX) 40 MG tablet, Take 1 tablet (40 mg total) by mouth daily., Disp: 90 tablet, Rfl: 1   vitamin E 600 UNIT capsule, Take 600 Units by mouth daily., Disp: , Rfl:   Review of Systems:  Constitutional: Denies fever, chills, diaphoresis, appetite change and fatigue.  HEENT: Denies photophobia, eye pain, redness, hearing loss, ear pain, congestion, sore throat, rhinorrhea, sneezing, mouth sores, trouble swallowing, neck pain, neck stiffness and tinnitus.   Respiratory: Denies SOB, DOE, cough, chest tightness,  and wheezing.   Cardiovascular: Denies chest pain, palpitations and leg swelling.  Gastrointestinal: Denies  diarrhea, constipation, blood in stool and abdominal distention.  Genitourinary: Denies dysuria, urgency, frequency, hematuria, flank pain and difficulty urinating.  Endocrine: Denies: hot or cold intolerance, sweats, changes in hair or nails, polyuria, polydipsia. Musculoskeletal: Denies myalgias, back pain, joint swelling, arthralgias and gait problem.  Skin: Denies pallor, rash and wound.  Neurological: Denies  seizures, syncope, weakness, l numbness Hematological: Denies  adenopathy. Easy bruising, personal or family bleeding history  Psychiatric/Behavioral: Denies suicidal ideation, mood changes, confusion, nervousness, sleep disturbance and agitation    Physical Exam: Vitals:   06/30/21 0836  BP: 110/70  Pulse: 72  Temp: 98 F (36.7 C)  TempSrc: Oral  SpO2: 94%  Weight: 144 lb 14.4 oz (65.7 kg)    Body mass index is 24.87 kg/m.   Constitutional: NAD, calm, comfortable Eyes: PERRL, lids and conjunctivae normal ENMT: Mucous membranes are moist.  Respiratory: clear to auscultation bilaterally, no wheezing, no crackles. Normal respiratory effort. No accessory muscle use.  Cardiovascular: Regular rate and rhythm, no murmurs / rubs / gallops. No extremity edema. Abdomen: no tenderness, no masses palpated. No hepatosplenomegaly. Bowel sounds positive.  Psychiatric: Normal judgment and insight. Alert and oriented x 3. Normal mood.    Impression and Plan:  Hospital discharge follow-up   Epigastric pain  - Plan: pantoprazole (PROTONIX) 40 MG tablet -I think over the weekend she had possibly an episode of food poisoning/viral gastroenteritis that seems to have mostly resolved. -Epigastric pain might be related to GERD/peptic ulcer disease.  Have advised that she take daily Protonix and follow-up with GI as instructed.  Budd-Chiari syndrome (HCC) -Meclizine provided  Time spent: 21 minutes reviewing chart, interviewing and examining patient and formulating plan of care.     Chaya Jan, MD [facility]

## 2021-07-26 ENCOUNTER — Encounter: Payer: Self-pay | Admitting: Nurse Practitioner

## 2021-07-26 ENCOUNTER — Ambulatory Visit (INDEPENDENT_AMBULATORY_CARE_PROVIDER_SITE_OTHER): Payer: BC Managed Care – PPO | Admitting: Nurse Practitioner

## 2021-07-26 VITALS — BP 100/60 | HR 78 | Ht 64.0 in | Wt 146.0 lb

## 2021-07-26 DIAGNOSIS — R1013 Epigastric pain: Secondary | ICD-10-CM | POA: Diagnosis not present

## 2021-07-26 NOTE — Patient Instructions (Signed)
If you are age 55 or older, your body mass index should be between 23-30. Your Body mass index is 25.06 kg/m. If this is out of the aforementioned range listed, please consider follow up with your Primary Care Provider.  If you are age 71 or younger, your body mass index should be between 19-25. Your Body mass index is 25.06 kg/m. If this is out of the aformentioned range listed, please consider follow up with your Primary Care Provider.   The [facility] [place] [facility] providers would like to encourage you to use [facility] to communicate with providers for non-urgent requests or questions.  Due to long hold times on the telephone, sending your provider a message by [facility] may be faster and more efficient way to get a response. Please allow 48 business hours for a response.  Please remember that this is for non-urgent requests/questions.  Please call our office if you have any recurrent upper abdominal pain.  It was great seeing you today! Thank you for entrusting me with your care and choosing [facility].  Willette Cluster, NP

## 2021-07-26 NOTE — Progress Notes (Signed)
ASSESSMENT AND PLAN    #55 year old female with epigastric pain, vomiting and diarrhea lasting several days in late September through early October.  She says labs at urgent care were fine.  She was sent for CTAP with contrast which is negative for any acute findings.  All of her symptoms resolved 2 to 3 weeks ago.  She never started the pantoprazole prescribed by PCP earlier this month for the epigastric pain.  Her appetite is fine. Bowel movements back to baseline. --Get labs from urgent care --Since all of her symptoms have resolved there is no need for further work-up at this time.  Should she have any recurrence of epigastric pain and or nausea or vomiting she will call the office.  HISTORY OF PRESENT ILLNESS    Chief Complaint : My doctor thinks I may need an endoscopy  [name] is a 55 y.o. female known to Dr. Rhea Powell with a past medical history significant for endometriosis, hysterectomy , SBO status post remote resection then recurrent SBO in August 2021 ( resolved with conservative measures).  See PMH below for any additional medical problems.     I saw [name] December 2021 for evaluation of RUQ pain . HIDA scan was negative.  RUQ ultrasound unremarkable.  Pain was most noticeable when vacuuming.  It was felt that she had musculoskeletal pain and was treated as such.   Interval history:  The RUQ pain for which she was seen in December 2021 resolved.  Patient is now here for evaluation of epigastric pain which has also resolved since making this appointment. In late September patient went to [facility] for evaluation epigastric pain, vomiting and diarrhea. .She was told her labs were okay. CTAP ordered.Marland Kitchen   CTAP w/ contrast for abdominal pain / vomiting No acute abdominopelvic findings. Specifically, no small bowel obstruction. 2. Moderate volume stool within the colon. 3. Aortic atherosclerosis (ICD10-I70.0)   Patient saw PCP for follow up on 10/7,. She was till  having some epigastric discomfort but her overall picture felt to probably represent gastroenteritis. However, because the epigastric pain PUD was considered. She was prescribed pantoprazole but insurance didn't pay it?  She was eventually able to get the prescription a couple a weeks ago. However she has not taken any of the pantoprazole because her pain totally resolved a few weeks ago. She is eating and drinking without problems.    [name] says her bowel movements have returned to normal though on a chronic basis she has  intermittent diarrhea ( usually 20-30 minutes after eating) wich she cannot really relate to any certain foods. She has these episodes about once a week. Otherwise her stools are solids.   As a side note , [name] was started on iron a few months ago, she doesn't know exactly when but says it was definitely less than 6 months ago. She has never seen blood in her stools and stools black only since starting iron. No menses in 25 years. Her hgb has generally been in the normal range ( without iron) but she has microcytosis.  We referred her to Hematology, seen there the beginning of this year.  Per their note her iron studies were normal.  Her B12 was borderline low in the setting of a history of B12 deficiency.  B12 supplementation was recommended.  PREVIOUS ENDOSCOPIC EVALUATIONS / PERTINENT STUDIES:    Screening colonoscopy Feb 2022 Diverticulosis in the sigmoid colon and in the descending colon. - The examination was otherwise  normal on direct and retroflexion views. - No specimens collected.   Past Medical History:  Diagnosis Date   Chiari I malformation ([place])    Dizziness 01/2020   Endometriosis 1990   Hyperglycemia    Jejunitis    Ringing in ears, bilateral 01/2020   Small bowel obstruction ([place]) 05/13/2020    Current Medications, Allergies, Past Surgical History, Family History and Social History were reviewed in [employer] record.    Current Outpatient Medications  Medication Sig Dispense Refill   levothyroxine (SYNTHROID) 50 MCG tablet 50 mcg.     pantoprazole (PROTONIX) 40 MG tablet Take 1 tablet (40 mg total) by mouth daily. (Patient not taking: Reported on 07/26/2021) 90 tablet 1   No current facility-administered medications for this visit.    Review of Systems: No chest pain. No shortness of breath. No urinary complaints.   PHYSICAL EXAM :    Wt Readings from Last 3 Encounters:  07/26/21 146 lb (66.2 kg)  06/30/21 144 lb 14.4 oz (65.7 kg)  04/20/21 144 lb (65.3 kg)    BP 100/60   Pulse 78   Ht 5\' 4"  (1.626 m)   Wt 146 lb (66.2 kg)   SpO2 98%   BMI 25.06 kg/m  Constitutional:  Generally well appearing female in no acute distress. Psychiatric: Pleasant. Normal mood and affect. Behavior is normal. EENT: Pupils normal.  Conjunctivae are normal. No scleral icterus. Neck supple.  Cardiovascular: Normal rate, regular rhythm. No edema Pulmonary/chest: Effort normal and breath sounds normal. No wheezing, rales or rhonchi. Abdominal: Soft, nondistended, nontender. Bowel sounds active throughout. There are no masses palpable. No hepatomegaly. Neurological: Alert and oriented to person place and time. Skin: Skin is warm and dry. No rashes noted.  Christina Savoy, NP  07/26/2021, 9:58 AM  Cc:   [name], [name]*

## 2021-07-27 NOTE — Progress Notes (Signed)
Addendum: Reviewed and agree with assessment and management plan. Tilton Marsalis M, MD  

## 2021-08-01 ENCOUNTER — Other Ambulatory Visit: Payer: Self-pay | Admitting: Internal Medicine

## 2021-08-01 ENCOUNTER — Telehealth: Payer: Self-pay

## 2021-08-01 NOTE — Telephone Encounter (Signed)
Patient called requesting Rx refill patient stated she only has 2 day supply and will be in [place] with her sister for appt and may have to stay overnight  levothyroxine (SYNTHROID) 50 MCG tablet

## 2021-08-02 MED ORDER — LEVOTHYROXINE SODIUM 50 MCG PO TABS: 50.0000 ug | ORAL_TABLET | Freq: Every day | ORAL | 1 refills | Status: DC

## 2021-08-02 NOTE — Addendum Note (Signed)
Addended by: Kern Reap B on: 08/02/2021 05:05 PM   Modules accepted: Orders

## 2021-08-02 NOTE — Telephone Encounter (Signed)
Pt is calling and she only has one synthroid pill left

## 2021-08-02 NOTE — Telephone Encounter (Signed)
Refill sent.

## 2021-10-12 DIAGNOSIS — H43813 Vitreous degeneration, bilateral: Secondary | ICD-10-CM | POA: Diagnosis not present

## 2021-10-12 DIAGNOSIS — H524 Presbyopia: Secondary | ICD-10-CM | POA: Diagnosis not present

## 2021-10-31 ENCOUNTER — Other Ambulatory Visit: Payer: Self-pay | Admitting: Internal Medicine

## 2022-01-22 ENCOUNTER — Encounter: Payer: Self-pay | Admitting: Family Medicine

## 2022-01-22 ENCOUNTER — Telehealth: Payer: Self-pay | Admitting: Internal Medicine

## 2022-01-22 ENCOUNTER — Telehealth (INDEPENDENT_AMBULATORY_CARE_PROVIDER_SITE_OTHER): Payer: BC Managed Care – PPO | Admitting: Family Medicine

## 2022-01-22 VITALS — Temp 98.1°F

## 2022-01-22 DIAGNOSIS — J029 Acute pharyngitis, unspecified: Secondary | ICD-10-CM

## 2022-01-22 DIAGNOSIS — R0981 Nasal congestion: Secondary | ICD-10-CM | POA: Diagnosis not present

## 2022-01-22 DIAGNOSIS — U071 COVID-19: Secondary | ICD-10-CM

## 2022-01-22 MED ORDER — FLUTICASONE PROPIONATE 50 MCG/ACT NA SUSP: 1.0000 | Freq: Every day | NASAL | 0 refills | Status: DC

## 2022-01-22 MED ORDER — MOLNUPIRAVIR EUA 200MG CAPSULE: 4.0000 | ORAL_CAPSULE | Freq: Two times a day (BID) | ORAL | 0 refills | Status: AC

## 2022-01-22 NOTE — Progress Notes (Signed)
Virtual Visit via Video Note ? ?I connected with [name] on 01/22/22 at 11:30 AM EDT by a video enabled telemedicine application 2/2 COVID-19 pandemic and verified that I am speaking with the correct person using two identifiers. ? Location patient: home ?Location provider:work or home office ?Persons participating in the virtual visit: patient, provider ? ?I discussed the limitations of evaluation and management by telemedicine and the availability of in person appointments. The patient expressed understanding and agreed to proceed. ? ?Chief Complaint  ?Patient presents with  ? Covid Positive  ?  Positive x 2 days; symptoms started x 3 days ago; h/a, cough, noticed green mucous, sore throat. Has taken Tylenol. Denies SOB or fever (highest 100).   ? ? ?HPI: ?Pt started feeling sick on Saturday 01/20/22 with body aches, burning sensation of back, HA, chills. ?COVID test positive on Sat 01/20/22. ?Burning sensation in throat when drinking hot tea.  States neck feels "fat".    Cough, spitting out green sputum, HA, sneezing, temp 99.1 F today, R ear pain, pressure in head, nasal congestion. ?Appetite is good. ?Drinking oj and tea ?Water makes her feel nauseas/throw up. ?Taking Tylenol arthritis strength, using heat for back pain. ? ?Pt had 3 COVID vaccines. ? ?Pt denies sick contacts.     ? ?Worried about making her sister who lives in the house with her sick as she has chronic health problems. ? ?Pt's husband is not sick. ?ROS: See pertinent positives and negatives per HPI. ? ?Past Medical History:  ?Diagnosis Date  ? Chiari I malformation (HCC)   ? Dizziness 01/2020  ? Endometriosis 1990  ? Hyperglycemia   ? Jejunitis   ? Ringing in ears, bilateral 01/2020  ? Small bowel obstruction (HCC) 05/13/2020  ? ? ?Past Surgical History:  ?Procedure Laterality Date  ? ABDOMINAL HYSTERECTOMY  1995  ? endometriosis  ? APPENDECTOMY  1995  ? at time of TAH for endometriosis  ? SMALL INTESTINE SURGERY  1998  ? ex lap, SB  resection for SBO  ? ? ?Family History  ?Problem Relation Age of Onset  ? Liver cancer Mother   ? Cancer Mother   ?     liver cancer   ? Diabetes Father   ? Hypertension Father   ? Cancer Sister   ? ? ? ?Current Outpatient Medications:  ?  Ascorbic Acid (VITAMIN C) 1000 MG tablet, Take 1,000 mg by mouth daily., Disp: , Rfl:  ?  calcium carbonate (OSCAL) 1500 (600 Ca) MG TABS tablet, Take 600 mg of elemental calcium by mouth daily., Disp: , Rfl:  ?  levothyroxine (SYNTHROID) 50 MCG tablet, Take 1 tablet by mouth once daily, Disp: 90 tablet, Rfl: 0 ?  pantoprazole (PROTONIX) 40 MG tablet, Take 1 tablet (40 mg total) by mouth daily. (Patient not taking: Reported on 01/22/2022), Disp: 90 tablet, Rfl: 1 ? ?EXAM: ? ?VITALS per patient if applicable: RR between 12-20 bpm ? ?GENERAL: alert, oriented, appears well and in no acute distress ? ?HEENT: atraumatic, conjunctiva clear, no obvious abnormalities on inspection of external nose and ears ? ?NECK: normal movements of the head and neck ? ?LUNGS: on inspection no signs of respiratory distress, breathing rate appears normal, no obvious gross SOB, gasping or wheezing ? ?CV: no obvious cyanosis ? ?MS: moves all visible extremities without noticeable abnormality ? ?PSYCH/NEURO: pleasant and cooperative, no obvious depression or anxiety, speech and thought processing grossly intact ? ?ASSESSMENT AND PLAN: ? ?Discussed the following assessment and plan: ? ?COVID-19  virus infection  ?-Positive at home COVID test on 01/20/22  with symptoms starting that same day. ?-expectant management including OTC cough/cold medications, gargling with warm salt water/Chloraseptic spray, Tylenol as needed, rest, hydration, etc. ?-Discussed r/b/a of antiviral medications.  Patient wishes to start molnupiravir. ?-We will also start Flonase nasal spray ?-Given strict precautions ?-Discussed duration of quarantine ?-Given strict precautions ?- Plan: molnupiravir EUA (LAGEVRIO) 200 mg CAPS capsule,  fluticasone (FLONASE) 50 MCG/ACT nasal spray ? ?Nasal congestion ? - Plan: fluticasone (FLONASE) 50 MCG/ACT nasal spray ? ?Sore throat ?-Likely viral etiology 2/2 COVID 19 infection. ?-Continue supportive care as above ?-Given patient's description of burning sensation and neck feeling "fat" discussed concern for developing tonsilar or pharyngeal abscess.  Also discussed s/s of strep pharyngitis. ?-Given strict precautions for increased/worsened symptoms ? ?F/u prn ? ?I discussed the assessment and treatment plan with the patient. The patient was provided an opportunity to ask questions and all were answered. The patient agreed with the plan and demonstrated an understanding of the instructions. ?  ?The patient was advised to call back or seek an in-person evaluation if the symptoms worsen or if the condition fails to improve as anticipated. ? ? ?Deeann Saint, MD  ? ?

## 2022-01-22 NOTE — Telephone Encounter (Signed)
Patient has scheduled a virtual visit with Dr Volanda Napoleon 01/22/22 ?

## 2022-01-22 NOTE — Telephone Encounter (Signed)
Patient called in due to testing Positive for COVID on Saturday 01/20/2022 and patient is coughing with green muscus, extremely sore throat... patient states she spoke with Triage Nurse on Saturday and she informed herto take Tylenol and plenty of fluids.  Patient would like to know what she can do for the sore throat and the coughing.Marland Kitchen  ?

## 2022-01-29 ENCOUNTER — Ambulatory Visit (INDEPENDENT_AMBULATORY_CARE_PROVIDER_SITE_OTHER): Payer: BC Managed Care – PPO | Admitting: Internal Medicine

## 2022-01-29 ENCOUNTER — Encounter: Payer: Self-pay | Admitting: Internal Medicine

## 2022-01-29 VITALS — BP 120/80 | HR 83 | Temp 98.1°F | Wt 152.0 lb

## 2022-01-29 DIAGNOSIS — U071 COVID-19: Secondary | ICD-10-CM | POA: Diagnosis not present

## 2022-01-29 DIAGNOSIS — G9331 Postviral fatigue syndrome: Secondary | ICD-10-CM | POA: Diagnosis not present

## 2022-01-29 DIAGNOSIS — J029 Acute pharyngitis, unspecified: Secondary | ICD-10-CM

## 2022-01-29 LAB — POCT RAPID STREP A (OFFICE): Rapid Strep A Screen: NEGATIVE

## 2022-01-29 NOTE — Progress Notes (Signed)
? ? ? ?Established Patient Office Visit ? ? ? ? ?CC/Reason for Visit: Fatigue, back pain ? ?HPI: [name] is a 56 y.o. female who is coming in today for the above mentioned reasons.  On April 29 she tested positive for COVID via home test.  She had lots of headache, body aches and a low-grade temperature.  She is now feeling fatigued, some dyspnea on exertion and some muscular upper back pain. ? ?Past Medical/Surgical History: ?Past Medical History:  ?Diagnosis Date  ? Chiari I malformation (HCC)   ? Dizziness 01/2020  ? Endometriosis 1990  ? Hyperglycemia   ? Jejunitis   ? Ringing in ears, bilateral 01/2020  ? Small bowel obstruction (HCC) 05/13/2020  ? ? ?Past Surgical History:  ?Procedure Laterality Date  ? ABDOMINAL HYSTERECTOMY  1995  ? endometriosis  ? APPENDECTOMY  1995  ? at time of TAH for endometriosis  ? SMALL INTESTINE SURGERY  1998  ? ex lap, SB resection for SBO  ? ? ?Social History: ? reports that she has never smoked. She has never used smokeless tobacco. She reports that she does not currently use alcohol. She reports that she does not use drugs. ? ?Allergies: ?Allergies  ?Allergen Reactions  ? Anaprox [Naproxen] Swelling and Other (See Comments)  ?  swelling and redness  ? ? ?Family History:  ?Family History  ?Problem Relation Age of Onset  ? Liver cancer Mother   ? Cancer Mother   ?     liver cancer   ? Diabetes Father   ? Hypertension Father   ? Cancer Sister   ? ? ? ?Current Outpatient Medications:  ?  levothyroxine (SYNTHROID) 50 MCG tablet, Take 1 tablet by mouth once daily, Disp: 90 tablet, Rfl: 0 ? ?Review of Systems:  ?Constitutional: Denies fever, chills, diaphoresis, appetite change ?HEENT: Denies photophobia, eye pain, redness, mouth sores, trouble swallowing, neck pain, neck stiffness and tinnitus.   ?Respiratory: Denies  chest tightness,  and wheezing.   ?Cardiovascular: Denies chest pain, palpitations and leg swelling.  ?Gastrointestinal: Denies nausea, vomiting, abdominal pain,  diarrhea, constipation, blood in stool and abdominal distention.  ?Genitourinary: Denies dysuria, urgency, frequency, hematuria, flank pain and difficulty urinating.  ?Endocrine: Denies: hot or cold intolerance, sweats, changes in hair or nails, polyuria, polydipsia. ?Musculoskeletal: Denies myalgias, back pain, joint swelling, arthralgias and gait problem.  ?Skin: Denies pallor, rash and wound.  ?Neurological: Denies dizziness, seizures, syncope, weakness, light-headedness, numbness. ?Hematological: Denies adenopathy. Easy bruising, personal or family bleeding history  ?Psychiatric/Behavioral: Denies suicidal ideation, mood changes, confusion, nervousness, sleep disturbance and agitation ? ? ? ?Physical Exam: ?Vitals:  ? 01/29/22 1555  ?BP: 120/80  ?Pulse: 83  ?Temp: 98.1 ?F (36.7 ?C)  ?TempSrc: Oral  ?SpO2: 96%  ?Weight: 152 lb (68.9 kg)  ? ? ?Body mass index is 26.09 kg/m?. ? ? ?Constitutional: NAD, calm, comfortable ?Eyes: PERRL, lids and conjunctivae normal ?ENMT: Mucous membranes are moist. Posterior pharynx is erythematous but clear of any exudate or lesions. Normal dentition. Tympanic membrane is pearly white, no erythema or bulging. ?Neck: normal, supple, no masses, no thyromegaly ?Respiratory: clear to auscultation bilaterally, no wheezing, no crackles. Normal respiratory effort. No accessory muscle use.  ?Cardiovascular: Regular rate and rhythm, no murmurs / rubs / gallops. No extremity edema. ?Psychiatric: Normal judgment and insight. Alert and oriented x 3. Normal mood.  ? ? ?Impression and Plan: ? ?Sore throat - Plan: POC Rapid Strep A ? ?COVID ? ?Postviral fatigue syndrome ? ?-Rapid strep test  is negative, suspect fatigue and muscle aches are postviral and related to COVID.  Clear lung auscultation is reassuring.  Advised to follow-up in 14 days or so if not better. ? ?Time spent:22 minutes reviewing chart, interviewing and examining patient and formulating plan of care. ? ? ? ? ? ?Chaya Jan, MD ?[facility] ? ? ?

## 2022-02-01 IMAGING — CT CT ABD-PELV W/ CM
2 of 5 series · 16 of 46 positions shown, 18 images · IV contrast (omnipaque)
Comparison: 11/08/2008

CLINICAL DATA: Periumbilical abdominal pain with nausea and
vomiting. History of prior bowel resection.

EXAM:
CT ABDOMEN AND PELVIS WITH CONTRAST
TECHNIQUE: Multidetector CT imaging of the abdomen and pelvis was performed
using the standard protocol following bolus administration of
intravenous contrast.
CONTRAST:  100mL OMNIPAQUE IOHEXOL 300 MG/ML  SOLN

[Series 3: axial st · axial · 0.83mm/px · z∈[+872,+1272]mm · 13 of 94 slices shown, 15 images]
[im 7/94  soft-tissue]
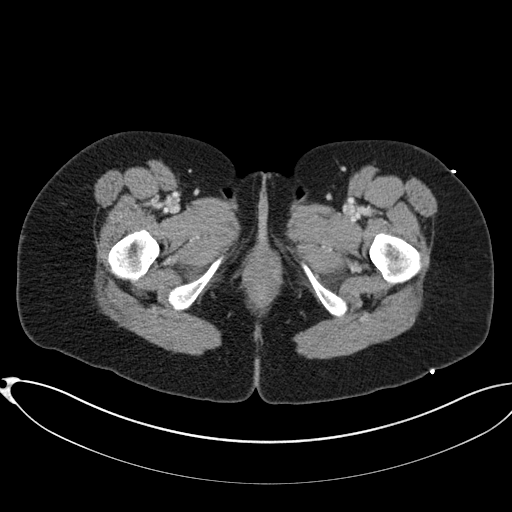
[im 7/94  bone]
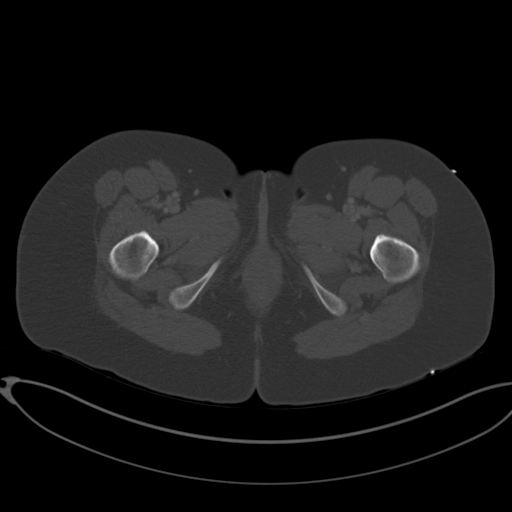
[im 13/94  soft-tissue]
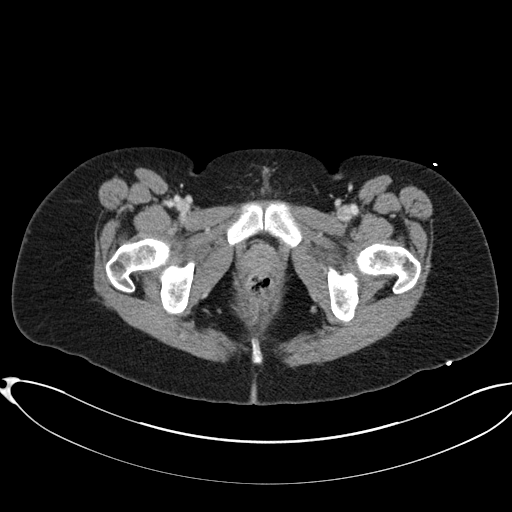
[im 19/94  soft-tissue]
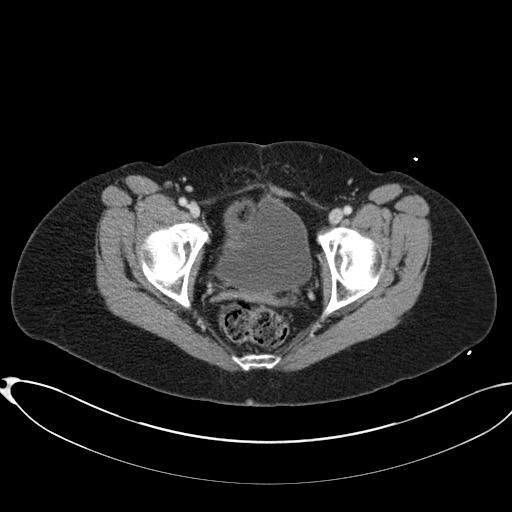
[im 25/94  soft-tissue]
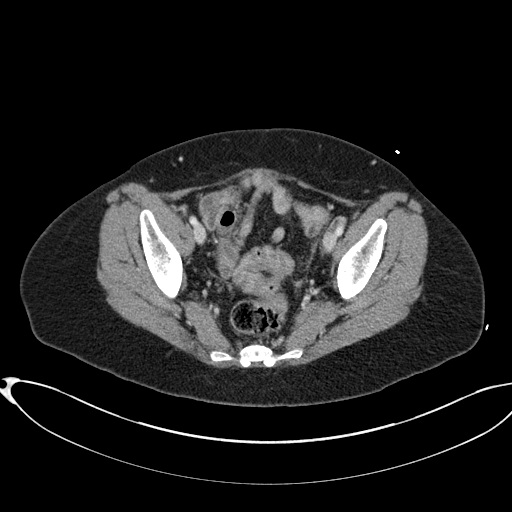
[im 32/94  soft-tissue]
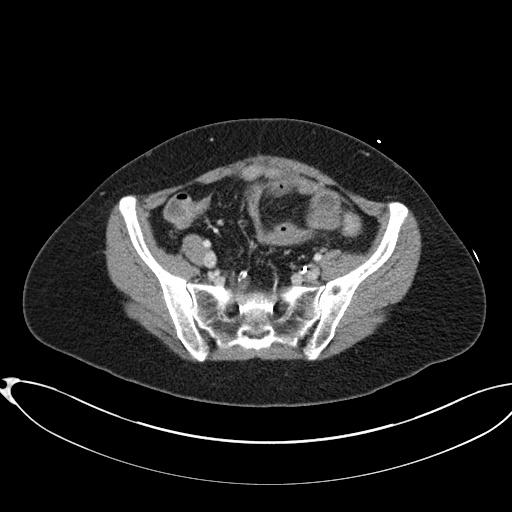
[im 38/94  soft-tissue]
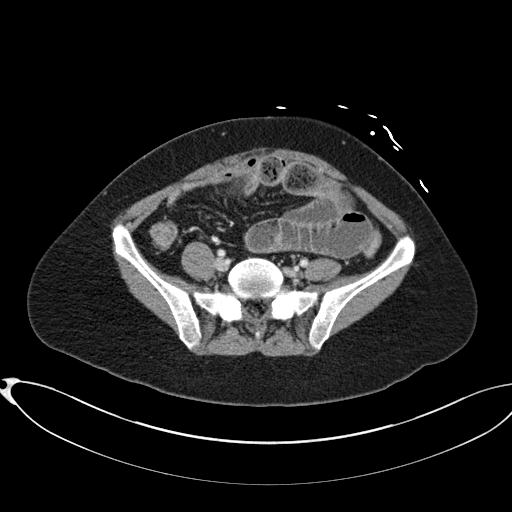
[im 50/94  soft-tissue]
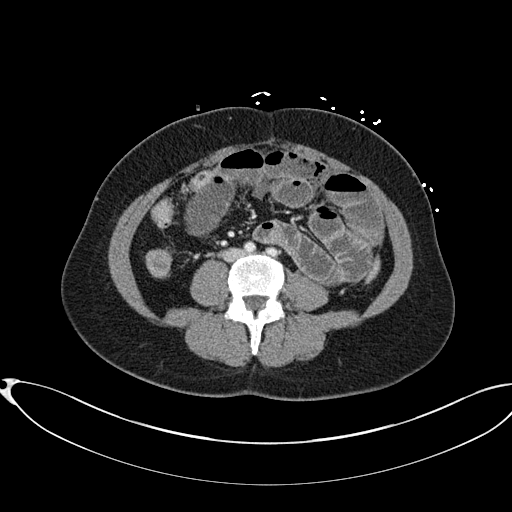
[im 56/94  soft-tissue]
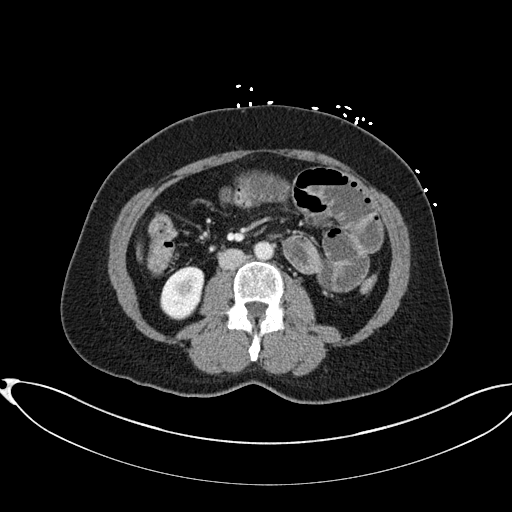
[im 63/94  soft-tissue]
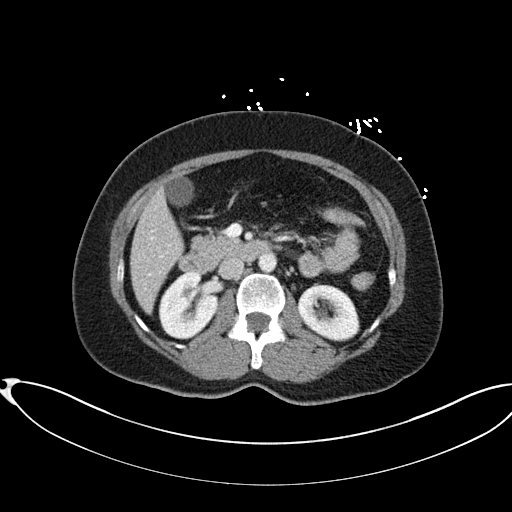
[im 63/94  bone]
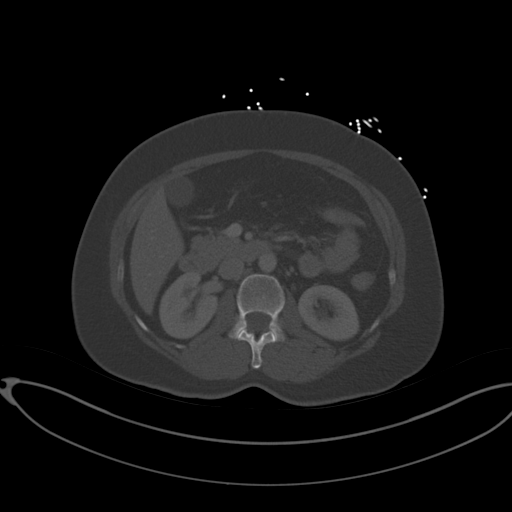
[im 69/94  soft-tissue]
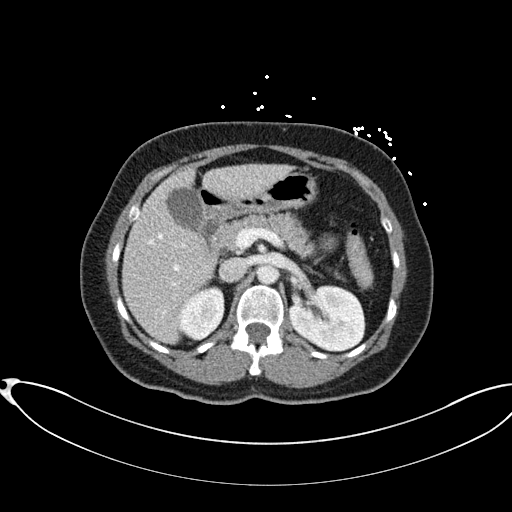
[im 75/94  soft-tissue]
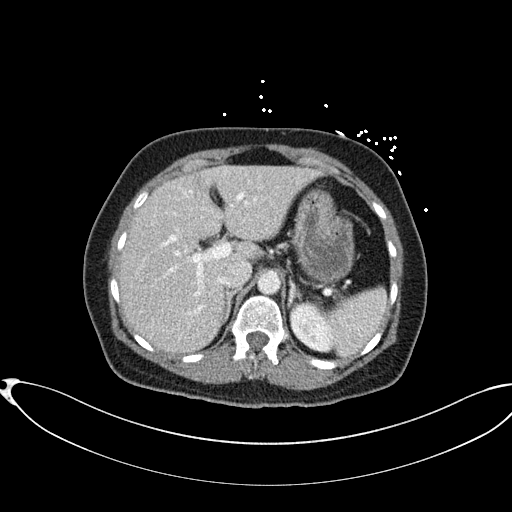
[im 81/94  soft-tissue]
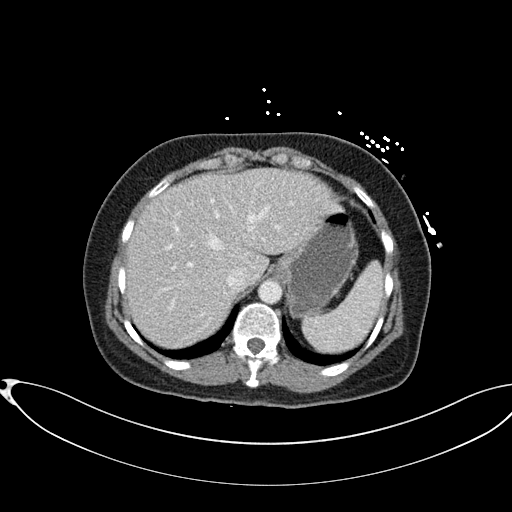
[im 87/94  soft-tissue]
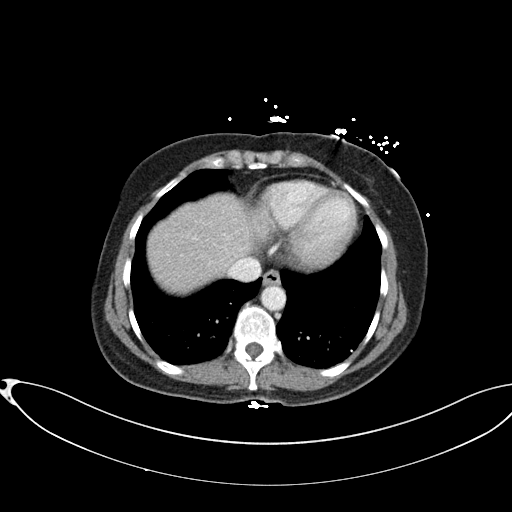

[Series 6: coronal st · coronal · 0.89mm/px · 3 of 146 slices shown]
[im 49/146  soft-tissue]
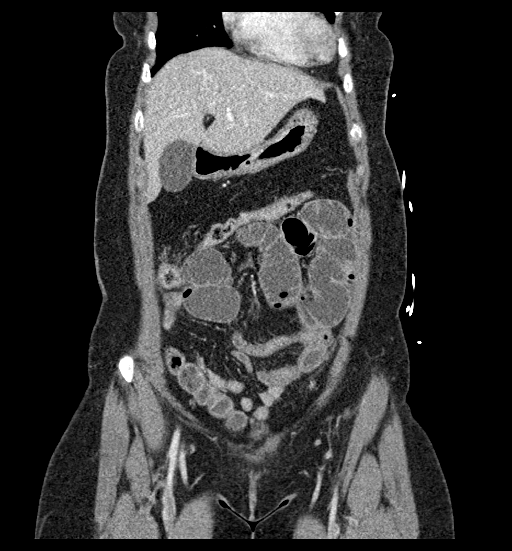
[im 65/146  soft-tissue]
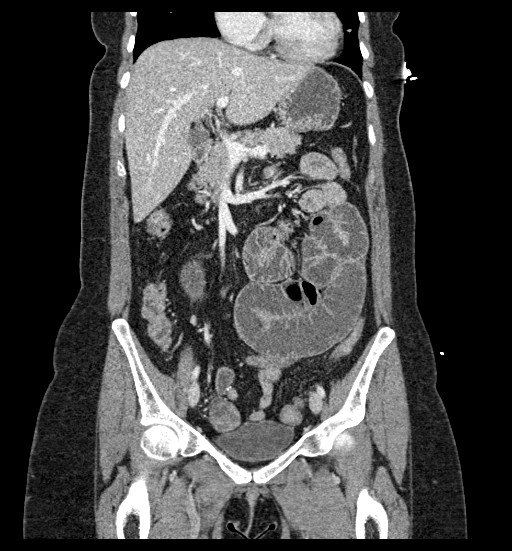
[im 81/146  soft-tissue]
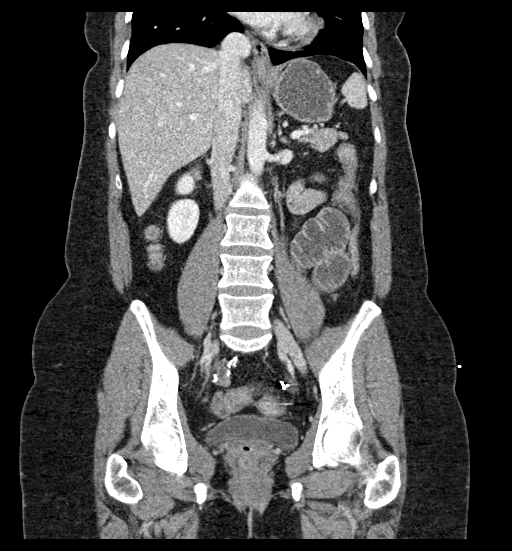

[16 of 46 positions shown; findings below may reference images not displayed]

FINDINGS: Lower chest: 6 mm nodular density within the lingula (series 5,
image 9) appears slightly more prominent compared to the prior
study, although differences may be somewhat attributable to slice
selection. Visualized lung bases are otherwise clear.

Hepatobiliary: No focal liver abnormality is seen. No gallstones,
gallbladder wall thickening, or biliary dilatation.

Pancreas: Unremarkable. No pancreatic ductal dilatation or
surrounding inflammatory changes.

Spleen: Normal in size without focal abnormality.

Adrenals/Urinary Tract: Unremarkable adrenal glands. Kidneys enhance
symmetrically without focal lesion, stone, or hydronephrosis.
Ureters are nondilated. Urinary bladder appears unremarkable.

Stomach/Bowel: Small-bowel obstruction with dilated loops of small
bowel measuring up to 3.8 cm in diameter. Transition point within
the central aspect of the lower abdomen anteriorly with fecalized
segment (series 3, images 62-63). The distal small bowel and colon
are decompressed. Gradual dilation of the upstream small bowel
without second transition point to suggest a closed loop
obstruction. Surgical anastomotic line within the low pelvis.

Vascular/Lymphatic: Scattered aortoiliac atherosclerotic
calcifications without aneurysm. No abdominopelvic lymphadenopathy.

Reproductive: Status post hysterectomy. No adnexal masses.

Other: No free fluid. No abdominopelvic fluid collection. No
pneumoperitoneum. No abdominal wall hernia.

Musculoskeletal: No acute or significant osseous findings.
IMPRESSION: 1. Small-bowel obstruction with transition point within the central
aspect of the lower abdomen anteriorly, likely secondary to
adhesions given evidence of prior abdominal surgery.
2. 6 mm nodular density within the lingula appears slightly more
prominent compared to the prior study, although differences may be
somewhat attributable to slice selection. Non-contrast chest CT at
6-12 months is recommended. If the nodule is stable at time of
repeat CT, then future CT at 18-24 months (from today's scan) is
considered optional for low-risk patients, but is recommended for
high-risk patients. This recommendation follows the consensus
statement: Guidelines for Management of Incidental Pulmonary Nodules
Detected on CT Images: From the [HOSPITAL] 9177; Radiology
9177; [DATE].

Aortic Atherosclerosis (XCXL5-YCK.K).

These results were called by telephone at the time of interpretation
on 05/13/2020 at [DATE] to provider AN NE SORIN , who verbally
acknowledged these results.

## 2022-02-01 IMAGING — DX DG ABD PORTABLE 1V
1 series · 1 of 1 positions shown · non-contrast
Comparison: None.

CLINICAL DATA: Status post NG tube placement.

EXAM:
PORTABLE ABDOMEN - 1 VIEW

[abdomen kub]
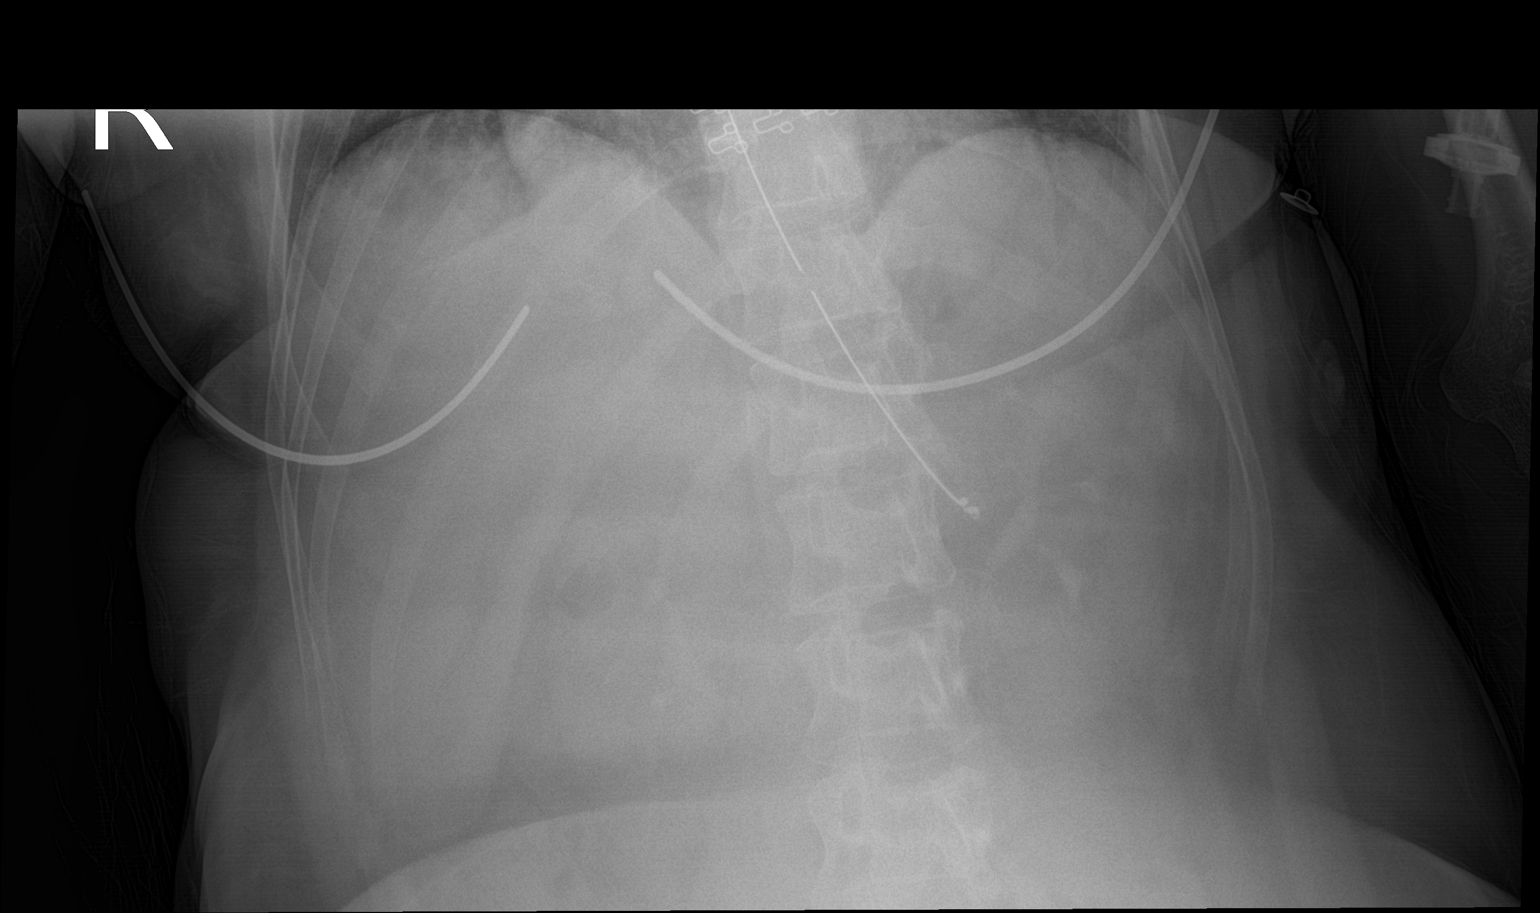

[1 of 1 positions shown; findings below may reference images not displayed]

FINDINGS: NG tube is in place with the side port just above the
gastroesophageal junction. Recommend advancement of 3-4 cm.
IMPRESSION: As above.

## 2022-03-20 DIAGNOSIS — M62838 Other muscle spasm: Secondary | ICD-10-CM | POA: Diagnosis not present

## 2022-03-20 DIAGNOSIS — R293 Abnormal posture: Secondary | ICD-10-CM | POA: Diagnosis not present

## 2022-03-20 DIAGNOSIS — M546 Pain in thoracic spine: Secondary | ICD-10-CM | POA: Diagnosis not present

## 2022-03-20 DIAGNOSIS — M4004 Postural kyphosis, thoracic region: Secondary | ICD-10-CM | POA: Diagnosis not present

## 2022-03-22 DIAGNOSIS — M62838 Other muscle spasm: Secondary | ICD-10-CM | POA: Diagnosis not present

## 2022-03-22 DIAGNOSIS — M4004 Postural kyphosis, thoracic region: Secondary | ICD-10-CM | POA: Diagnosis not present

## 2022-03-22 DIAGNOSIS — R293 Abnormal posture: Secondary | ICD-10-CM | POA: Diagnosis not present

## 2022-03-22 DIAGNOSIS — M546 Pain in thoracic spine: Secondary | ICD-10-CM | POA: Diagnosis not present

## 2022-03-29 DIAGNOSIS — M4004 Postural kyphosis, thoracic region: Secondary | ICD-10-CM | POA: Diagnosis not present

## 2022-03-29 DIAGNOSIS — M62838 Other muscle spasm: Secondary | ICD-10-CM | POA: Diagnosis not present

## 2022-03-29 DIAGNOSIS — M546 Pain in thoracic spine: Secondary | ICD-10-CM | POA: Diagnosis not present

## 2022-03-29 DIAGNOSIS — R293 Abnormal posture: Secondary | ICD-10-CM | POA: Diagnosis not present

## 2022-04-03 DIAGNOSIS — R293 Abnormal posture: Secondary | ICD-10-CM | POA: Diagnosis not present

## 2022-04-03 DIAGNOSIS — M4004 Postural kyphosis, thoracic region: Secondary | ICD-10-CM | POA: Diagnosis not present

## 2022-04-03 DIAGNOSIS — M546 Pain in thoracic spine: Secondary | ICD-10-CM | POA: Diagnosis not present

## 2022-04-03 DIAGNOSIS — M62838 Other muscle spasm: Secondary | ICD-10-CM | POA: Diagnosis not present

## 2022-04-12 DIAGNOSIS — R293 Abnormal posture: Secondary | ICD-10-CM | POA: Diagnosis not present

## 2022-04-12 DIAGNOSIS — M62838 Other muscle spasm: Secondary | ICD-10-CM | POA: Diagnosis not present

## 2022-04-12 DIAGNOSIS — M4004 Postural kyphosis, thoracic region: Secondary | ICD-10-CM | POA: Diagnosis not present

## 2022-04-12 DIAGNOSIS — M546 Pain in thoracic spine: Secondary | ICD-10-CM | POA: Diagnosis not present

## 2022-04-17 DIAGNOSIS — M546 Pain in thoracic spine: Secondary | ICD-10-CM | POA: Diagnosis not present

## 2022-04-17 DIAGNOSIS — M62838 Other muscle spasm: Secondary | ICD-10-CM | POA: Diagnosis not present

## 2022-04-17 DIAGNOSIS — R293 Abnormal posture: Secondary | ICD-10-CM | POA: Diagnosis not present

## 2022-04-17 DIAGNOSIS — M4004 Postural kyphosis, thoracic region: Secondary | ICD-10-CM | POA: Diagnosis not present

## 2022-04-26 ENCOUNTER — Ambulatory Visit: Payer: BC Managed Care – PPO | Admitting: Neurology

## 2022-04-26 DIAGNOSIS — M4004 Postural kyphosis, thoracic region: Secondary | ICD-10-CM | POA: Diagnosis not present

## 2022-04-26 DIAGNOSIS — R293 Abnormal posture: Secondary | ICD-10-CM | POA: Diagnosis not present

## 2022-04-26 DIAGNOSIS — M546 Pain in thoracic spine: Secondary | ICD-10-CM | POA: Diagnosis not present

## 2022-04-26 DIAGNOSIS — M62838 Other muscle spasm: Secondary | ICD-10-CM | POA: Diagnosis not present

## 2022-04-27 DIAGNOSIS — M47814 Spondylosis without myelopathy or radiculopathy, thoracic region: Secondary | ICD-10-CM | POA: Diagnosis not present

## 2022-04-27 DIAGNOSIS — M7918 Myalgia, other site: Secondary | ICD-10-CM | POA: Diagnosis not present

## 2022-05-01 DIAGNOSIS — M546 Pain in thoracic spine: Secondary | ICD-10-CM | POA: Diagnosis not present

## 2022-05-01 DIAGNOSIS — M62838 Other muscle spasm: Secondary | ICD-10-CM | POA: Diagnosis not present

## 2022-05-01 DIAGNOSIS — R293 Abnormal posture: Secondary | ICD-10-CM | POA: Diagnosis not present

## 2022-05-01 DIAGNOSIS — M4004 Postural kyphosis, thoracic region: Secondary | ICD-10-CM | POA: Diagnosis not present

## 2022-05-15 DIAGNOSIS — M62838 Other muscle spasm: Secondary | ICD-10-CM | POA: Diagnosis not present

## 2022-05-15 DIAGNOSIS — R293 Abnormal posture: Secondary | ICD-10-CM | POA: Diagnosis not present

## 2022-05-15 DIAGNOSIS — M4004 Postural kyphosis, thoracic region: Secondary | ICD-10-CM | POA: Diagnosis not present

## 2022-05-15 DIAGNOSIS — M546 Pain in thoracic spine: Secondary | ICD-10-CM | POA: Diagnosis not present

## 2022-05-22 DIAGNOSIS — M546 Pain in thoracic spine: Secondary | ICD-10-CM | POA: Diagnosis not present

## 2022-05-22 DIAGNOSIS — M62838 Other muscle spasm: Secondary | ICD-10-CM | POA: Diagnosis not present

## 2022-05-22 DIAGNOSIS — M4004 Postural kyphosis, thoracic region: Secondary | ICD-10-CM | POA: Diagnosis not present

## 2022-05-22 DIAGNOSIS — R293 Abnormal posture: Secondary | ICD-10-CM | POA: Diagnosis not present

## 2022-05-31 DIAGNOSIS — R293 Abnormal posture: Secondary | ICD-10-CM | POA: Diagnosis not present

## 2022-05-31 DIAGNOSIS — M546 Pain in thoracic spine: Secondary | ICD-10-CM | POA: Diagnosis not present

## 2022-05-31 DIAGNOSIS — M4004 Postural kyphosis, thoracic region: Secondary | ICD-10-CM | POA: Diagnosis not present

## 2022-05-31 DIAGNOSIS — M62838 Other muscle spasm: Secondary | ICD-10-CM | POA: Diagnosis not present

## 2022-06-01 DIAGNOSIS — Z1231 Encounter for screening mammogram for malignant neoplasm of breast: Secondary | ICD-10-CM | POA: Diagnosis not present

## 2022-06-01 DIAGNOSIS — Z6826 Body mass index (BMI) 26.0-26.9, adult: Secondary | ICD-10-CM | POA: Diagnosis not present

## 2022-06-01 DIAGNOSIS — Z01419 Encounter for gynecological examination (general) (routine) without abnormal findings: Secondary | ICD-10-CM | POA: Diagnosis not present

## 2022-06-01 LAB — HM MAMMOGRAPHY

## 2022-06-06 DIAGNOSIS — M62838 Other muscle spasm: Secondary | ICD-10-CM | POA: Diagnosis not present

## 2022-06-06 DIAGNOSIS — M4004 Postural kyphosis, thoracic region: Secondary | ICD-10-CM | POA: Diagnosis not present

## 2022-06-06 DIAGNOSIS — R293 Abnormal posture: Secondary | ICD-10-CM | POA: Diagnosis not present

## 2022-06-06 DIAGNOSIS — M546 Pain in thoracic spine: Secondary | ICD-10-CM | POA: Diagnosis not present

## 2022-07-16 ENCOUNTER — Ambulatory Visit: Payer: BC Managed Care – PPO | Admitting: Neurology

## 2022-07-16 ENCOUNTER — Encounter: Payer: Self-pay | Admitting: Neurology

## 2022-07-16 VITALS — BP 120/71 | HR 74 | Ht 64.0 in | Wt 154.0 lb

## 2022-07-16 DIAGNOSIS — G935 Compression of brain: Secondary | ICD-10-CM

## 2022-07-16 DIAGNOSIS — G44229 Chronic tension-type headache, not intractable: Secondary | ICD-10-CM | POA: Diagnosis not present

## 2022-07-16 NOTE — Progress Notes (Signed)
[facility]  PATIENT: [name] DOB: 01/02/1966  REFERRING CLINICIAN: Isaac Bliss, [place]* HISTORY FROM: patient and friend Rose  REASON FOR VISIT: Headaches    HISTORICAL  CHIEF COMPLAINT:  Chief Complaint  Patient presents with   Follow-up    Room 15, alone States she is stable      INTERVAL HISTORY 07/16/22:  Patient presents today for follow-up, since last visit last July 2022, she has been doing well.  She reports on occasion she has headaches for which she take Aleve or Motrin, headache usually go away within 45 minutes.  Currently she does not have any other concern.  Overall she is doing well.     HISTORY OF PRESENT ILLNESS:  This is a 56 year old woman with past medical history of hypothyroidism who is presenting for evaluation of her headaches.  She reports a long standing history of headaches that started in her 30s.  For headaches, patient stated they usually start on back of her head.  When she gets the headaches usually, they last a couple hours and she takes Aleve's.  Headaches are associated with nausea but no vomiting and also phonophobia.  Patient also states that phonophobia may be related to her chronic tinnitus.  She report that she has severe headache that he describes as migraine and stated in the past 12 years she has had about 1 episode  of severe headaches with inability to walk lasting 2 days that required to go to the hospital.  During that 1 episode she had severe dizziness, nausea associated with a headache.  But now, she described having only mild headache.  On May 11 she had an episode of headaches and dizziness, contacted her primary care doctor who referred her to the ED because of the dizziness.  During evaluation in the ED she had a MRI brain which show a Chiari I malformation.  She was treated with IV fluid meclizine and referred to neurology for further work-up and management   Headache History and  Characteristics: Onset: Started in her 56s  Location: back of head  Quality: Pressure, feels like squeezing my brain.  Intensity: 8/10.  Duration: can last a couple of hours. Migrainous Features: phonophobia Aura: Seeing flashing of light  History of brain injury or tumor: No  Previous Treatment:  Prophylactic: No  Abortive: Aleve Injections: No    Aura:  photophobia/ phonophobia:  nausea/ vomiting: yes   Family history: Sister with migraines  Motion sickness: no Cardiac history: no  OTC: Aleve Caffeine: no Sleep: Poor, cannot stay asleep, goes to bed at 11-12, fall asleep within an hour then wakes up at 630AM Mood/ Stress: Good   Prior prophylaxis: Propranolol: No  Verapamil:No TCA: No Topamax: No Depakote: No Effexor: No Cymbalta: No Neurontin:No  Prior abortives: Triptan: No Anti-emetic: No Steroids: No Ergotamine suppository: No  Prior interventions None   REVIEW OF SYSTEMS: Full 14 system review of systems performed and negative with exception of: as noted in the HPI  ALLERGIES: Allergies  Allergen Reactions   Anaprox [Naproxen] Swelling and Other (See Comments)    swelling and redness    HOME MEDICATIONS: Outpatient Medications Prior to Visit  Medication Sig Dispense Refill   levothyroxine (SYNTHROID) 50 MCG tablet Take 1 tablet by mouth once daily 90 tablet 0   No facility-administered medications prior to visit.    PAST MEDICAL HISTORY: Past Medical History:  Diagnosis Date   Chiari I malformation ([place])    Dizziness 01/2020   Endometriosis  1990   Hyperglycemia    Jejunitis    Ringing in ears, bilateral 01/2020   Small bowel obstruction ([place]) 05/13/2020    PAST SURGICAL HISTORY: Past Surgical History:  Procedure Laterality Date   ABDOMINAL HYSTERECTOMY  1995   endometriosis   APPENDECTOMY  1995   at time of TAH for endometriosis   SMALL INTESTINE SURGERY  1998   ex lap, SB resection for SBO    FAMILY HISTORY: Family  History  Problem Relation Age of Onset   Liver cancer Mother    Cancer Mother        liver cancer    Diabetes Father    Hypertension Father    Cancer Sister     SOCIAL HISTORY: Social History   Socioeconomic History   Marital status: Married    Spouse name: Not on file   Number of children: 0   Years of education: 11th   Highest education level: Not on file  Occupational History   Occupation: Microbiologist houses  Tobacco Use   Smoking status: Never   Smokeless tobacco: Never  Vaping Use   Vaping Use: Never used  Substance and Sexual Activity   Alcohol use: Not Currently   Drug use: Never   Sexual activity: Yes  Other Topics Concern   Not on file  Social History Narrative   Lives with husband.   Right-handed.   No daily use of caffeine.   Social Determinants of Health   Financial Resource Strain: Not on file  Food Insecurity: Not on file  Transportation Needs: Not on file  Physical Activity: Not on file  Stress: Not on file  Social Connections: Not on file  Intimate Partner Violence: Not on file     PHYSICAL EXAM  GENERAL EXAM/CONSTITUTIONAL: Vitals:  Vitals:   07/16/22 0920  BP: 120/71  Pulse: 74  Weight: 154 lb (69.9 kg)  Height: 5\' 4"  (1.626 m)   Body mass index is 26.43 kg/m. Wt Readings from Last 3 Encounters:  07/16/22 154 lb (69.9 kg)  01/29/22 152 lb (68.9 kg)  07/26/21 146 lb (66.2 kg)   Patient is in no distress; well developed, nourished and groomed; neck is supple  CARDIOVASCULAR: Examination of carotid arteries is normal; no carotid bruits Regular rate and rhythm, no murmurs Examination of peripheral vascular system by observation and palpation is normal  EYES: Pupils round and reactive to light, Visual fields full to confrontation, Extraocular movements intacts,   MUSCULOSKELETAL: Gait, strength, tone, movements noted in Neurologic exam below  NEUROLOGIC: MENTAL STATUS:      No data to display         awake, alert,  oriented to person, place and time recent and remote memory intact normal attention and concentration language fluent, comprehension intact, naming intact fund of knowledge appropriate  CRANIAL NERVE:  2nd - no papilledema or hemorrhages on fundoscopic exam 2nd, 3rd, 4th, 6th - pupils equal and reactive to light, visual fields full to confrontation, extraocular muscles intact, no nystagmus 5th - facial sensation symmetric 7th - facial strength symmetric 8th - hearing intact 9th - palate elevates symmetrically, uvula midline 11th - shoulder shrug symmetric 12th - tongue protrusion midline  MOTOR:  normal bulk and tone, full strength in the BUE, BLE  SENSORY:  normal and symmetric to light touch, pinprick, temperature, vibration  COORDINATION:  finger-nose-finger, fine finger movements normal  REFLEXES:  deep tendon reflexes present and symmetric  GAIT/STATION:  normal     DIAGNOSTIC DATA (LABS, IMAGING,  TESTING) - I reviewed patient records, labs, notes, testing and imaging myself where available.  Lab Results  Component Value Date   WBC 6.1 01/16/2021   HGB 12.7 01/16/2021   HCT 40.1 01/16/2021   MCV 70.7 (L) 01/16/2021   PLT 251.0 01/16/2021      Component Value Date/Time   NA 140 01/16/2021 0708   NA 144 01/16/2019 1425   K 4.3 01/16/2021 0708   CL 105 01/16/2021 0708   CO2 25 01/16/2021 0708   GLUCOSE 86 01/16/2021 0708   BUN 19 01/16/2021 0708   BUN 16 01/16/2019 1425   CREATININE 0.70 01/16/2021 0708   CREATININE 0.80 10/13/2020 1209   CALCIUM 9.1 01/16/2021 0708   PROT 6.8 01/16/2021 0708   PROT 7.1 01/16/2019 1425   ALBUMIN 3.9 01/16/2021 0708   ALBUMIN 4.7 01/16/2019 1425   AST 13 01/16/2021 0708   AST 12 (L) 10/13/2020 1209   ALT 13 01/16/2021 0708   ALT 12 10/13/2020 1209   ALKPHOS 44 01/16/2021 0708   BILITOT 0.3 01/16/2021 0708   BILITOT 0.5 10/13/2020 1209   GFRNONAA >60 10/13/2020 1209   GFRAA >60 05/19/2020 0514   Lab Results   Component Value Date   CHOL 172 01/16/2021   HDL 48.00 01/16/2021   LDLCALC 103 (H) 01/16/2021   TRIG 107.0 01/16/2021   CHOLHDL 4 01/16/2021   Lab Results  Component Value Date   HGBA1C 5.8 (A) 02/03/2020   Lab Results  Component Value Date   VITAMINB12 224 10/13/2020   Lab Results  Component Value Date   TSH 1.55 04/14/2021    MRI Brain reviewed:  1. No acute intracranial abnormality. 2. Chiari 1 malformation with the cerebellar tonsils extending up to 1 cm below the foramen magnum. 3. Otherwise unremarkable and normal brain MRI for age.    ASSESSMENT AND PLAN  56 y.o. year old female past medical history of hypothyroidism on Synthroid, who is presenting for evaluation of a headache associated with Chiari I malformation.  Currently she reports that her headaches are very few, most the time she does not take any medication but if she takes Tylenol or Motrin the headaches will go away within 45 minutes.  Otherwise no other concerns or complaint.  1. Chiari malformation type I ([place])   2. Chronic tension-type headache, not intractable      PLAN: Continue your current medication  Continue with Aleve as needed for pain  Follow up as needed    No orders of the defined types were placed in this encounter.    No orders of the defined types were placed in this encounter.    Return if symptoms worsen or fail to improve.    Alric Ran, MD 07/16/2022, 12:19 PM  [facility] [address] [place], [place] [address] [phone]

## 2022-08-01 ENCOUNTER — Other Ambulatory Visit: Payer: Self-pay | Admitting: Internal Medicine

## 2022-08-21 ENCOUNTER — Other Ambulatory Visit: Payer: Self-pay | Admitting: Internal Medicine

## 2022-09-28 ENCOUNTER — Other Ambulatory Visit: Payer: Self-pay | Admitting: Internal Medicine

## 2022-10-02 DIAGNOSIS — H524 Presbyopia: Secondary | ICD-10-CM | POA: Diagnosis not present

## 2022-10-02 DIAGNOSIS — H43813 Vitreous degeneration, bilateral: Secondary | ICD-10-CM | POA: Diagnosis not present

## 2022-10-15 ENCOUNTER — Encounter: Payer: Self-pay | Admitting: Internal Medicine

## 2022-10-15 ENCOUNTER — Ambulatory Visit (INDEPENDENT_AMBULATORY_CARE_PROVIDER_SITE_OTHER): Payer: BC Managed Care – PPO | Admitting: Internal Medicine

## 2022-10-15 ENCOUNTER — Other Ambulatory Visit: Payer: Self-pay | Admitting: Internal Medicine

## 2022-10-15 ENCOUNTER — Encounter: Payer: BC Managed Care – PPO | Admitting: Internal Medicine

## 2022-10-15 VITALS — BP 110/80 | HR 70 | Temp 98.5°F | Ht 63.5 in | Wt 147.6 lb

## 2022-10-15 DIAGNOSIS — E039 Hypothyroidism, unspecified: Secondary | ICD-10-CM

## 2022-10-15 DIAGNOSIS — R7302 Impaired glucose tolerance (oral): Secondary | ICD-10-CM | POA: Insufficient documentation

## 2022-10-15 DIAGNOSIS — E559 Vitamin D deficiency, unspecified: Secondary | ICD-10-CM | POA: Diagnosis not present

## 2022-10-15 DIAGNOSIS — Z0000 Encounter for general adult medical examination without abnormal findings: Secondary | ICD-10-CM

## 2022-10-15 DIAGNOSIS — E785 Hyperlipidemia, unspecified: Secondary | ICD-10-CM | POA: Insufficient documentation

## 2022-10-15 DIAGNOSIS — R718 Other abnormality of red blood cells: Secondary | ICD-10-CM

## 2022-10-15 LAB — COMPREHENSIVE METABOLIC PANEL
ALT: 18 U/L (ref 0–35)
AST: 18 U/L (ref 0–37)
Albumin: 4.6 g/dL (ref 3.5–5.2)
Alkaline Phosphatase: 43 U/L (ref 39–117)
BUN: 15 mg/dL (ref 6–23)
CO2: 25 mEq/L (ref 19–32)
Calcium: 9.6 mg/dL (ref 8.4–10.5)
Chloride: 102 mEq/L (ref 96–112)
Creatinine, Ser: 0.65 mg/dL (ref 0.40–1.20)
GFR: 98.43 mL/min (ref >60.00)
Glucose, Bld: 91 mg/dL (ref 70–99)
Potassium: 4 mEq/L (ref 3.5–5.1)
Sodium: 139 mEq/L (ref 135–145)
Total Bilirubin: 0.3 mg/dL (ref 0.2–1.2)
Total Protein: 7.9 g/dL (ref 6.0–8.3)

## 2022-10-15 LAB — CBC WITH DIFFERENTIAL/PLATELET
Basophils Absolute: 0 10*3/uL (ref 0.0–0.1)
Basophils Relative: 0.5 % (ref 0.0–3.0)
Eosinophils Absolute: 0.1 10*3/uL (ref 0.0–0.7)
Eosinophils Relative: 1.1 % (ref 0.0–5.0)
HCT: 43.5 % (ref 36.0–46.0)
Hemoglobin: 13.6 g/dL (ref 12.0–15.0)
Lymphocytes Relative: 26.7 % (ref 12.0–46.0)
Lymphs Abs: 1.9 10*3/uL (ref 0.7–4.0)
MCHC: 31.2 g/dL (ref 30.0–36.0)
MCV: 72.3 fl — ABNORMAL LOW (ref 78.0–100.0)
Monocytes Absolute: 0.6 10*3/uL (ref 0.1–1.0)
Monocytes Relative: 8.2 % (ref 3.0–12.0)
Neutro Abs: 4.6 10*3/uL (ref 1.4–7.7)
Neutrophils Relative %: 63.5 % (ref 43.0–77.0)
Platelets: 278 10*3/uL (ref 150.0–400.0)
RBC: 6.02 Mil/uL — ABNORMAL HIGH (ref 3.87–5.11)
RDW: 14.5 % (ref 11.5–15.5)
WBC: 7.3 10*3/uL (ref 4.0–10.5)

## 2022-10-15 LAB — LIPID PANEL
Cholesterol: 221 mg/dL — ABNORMAL HIGH (ref 0–200)
HDL: 54.9 mg/dL (ref >39.00)
LDL Cholesterol: 138 mg/dL — ABNORMAL HIGH (ref 0–99)
NonHDL: 165.82
Total CHOL/HDL Ratio: 4
Triglycerides: 137 mg/dL (ref 0.0–149.0)
VLDL: 27.4 mg/dL (ref 0.0–40.0)

## 2022-10-15 LAB — VITAMIN D 25 HYDROXY (VIT D DEFICIENCY, FRACTURES): VITD: 23.94 ng/mL — ABNORMAL LOW (ref 30.00–100.00)

## 2022-10-15 LAB — TSH: TSH: 5.25 u[IU]/mL (ref 0.35–5.50)

## 2022-10-15 LAB — HEMOGLOBIN A1C: Hgb A1c MFr Bld: 6.2 % (ref 4.6–6.5)

## 2022-10-15 LAB — VITAMIN B12: Vitamin B-12: 771 pg/mL (ref 211–911)

## 2022-10-15 MED ORDER — MECLIZINE HCL 25 MG PO TABS: 25.0000 mg | ORAL_TABLET | Freq: Every day | ORAL | 0 refills | Status: DC

## 2022-10-15 MED ORDER — VITAMIN D (ERGOCALCIFEROL) 1.25 MG (50000 UNIT) PO CAPS
50000.0000 [IU] | ORAL_CAPSULE | ORAL | 0 refills | Status: AC
Start: 2022-10-15 — End: 2023-01-01

## 2022-10-15 NOTE — Progress Notes (Signed)
Established Patient Office Visit     CC/Reason for Visit: Annual preventive exam  HPI: [name] is a 57 y.o. female who is coming in today for the above mentioned reasons. Past Medical History is significant for: Hypothyroidism, vitamin D and iron deficiencies.  She is feeling well and has no major concerns or complaints.  She has routine eye and dental care.  She is due for flu and COVID vaccines, all cancer screening is up-to-date.   Past Medical/Surgical History: Past Medical History:  Diagnosis Date   Chiari I malformation ([place])    Dizziness 01/2020   Endometriosis 1990   Hyperglycemia    Jejunitis    Ringing in ears, bilateral 01/2020   Small bowel obstruction ([place]) 05/13/2020    Past Surgical History:  Procedure Laterality Date   ABDOMINAL HYSTERECTOMY  1995   endometriosis   APPENDECTOMY  1995   at time of TAH for endometriosis   SMALL INTESTINE SURGERY  1998   ex lap, SB resection for SBO    Social History:  reports that she has never smoked. She has never used smokeless tobacco. She reports that she does not currently use alcohol. She reports that she does not use drugs.  Allergies: Allergies  Allergen Reactions   Anaprox [Naproxen] Swelling and Other (See Comments)    swelling and redness    Family History:  Family History  Problem Relation Age of Onset   Liver cancer Mother    Cancer Mother        liver cancer    Diabetes Father    Hypertension Father    Cancer Sister      Current Outpatient Medications:    levothyroxine (SYNTHROID) 50 MCG tablet, TAKE 1 TABLET BY MOUTH ONCE DAILY. APPOINMENT  REQUIRED FOR FUTURE REFILLS., Disp: 90 tablet, Rfl: 0  Review of Systems:  Negative unless indicated in HPI.   Physical Exam: Vitals:   10/15/22 0905  BP: 110/80  Pulse: 70  Temp: 98.5 F (36.9 C)  TempSrc: Oral  SpO2: 99%  Weight: 147 lb 9.6 oz (67 kg)  Height: 5' 3.5" (1.613 m)    Body mass index is 25.74 kg/m.   Physical  Exam Vitals reviewed.  Constitutional:      General: She is not in acute distress.    Appearance: Normal appearance. She is not ill-appearing, toxic-appearing or diaphoretic.  HENT:     Head: Normocephalic.     Right Ear: Tympanic membrane, ear canal and external ear normal. There is no impacted cerumen.     Left Ear: Tympanic membrane, ear canal and external ear normal. There is no impacted cerumen.     Nose: Nose normal.     Mouth/Throat:     Mouth: Mucous membranes are moist.     Pharynx: Oropharynx is clear. No oropharyngeal exudate or posterior oropharyngeal erythema.  Eyes:     General: No scleral icterus.       Right eye: No discharge.        Left eye: No discharge.     Conjunctiva/sclera: Conjunctivae normal.     Pupils: Pupils are equal, round, and reactive to light.  Neck:     Vascular: No carotid bruit.  Cardiovascular:     Rate and Rhythm: Normal rate and regular rhythm.     Pulses: Normal pulses.     Heart sounds: Normal heart sounds.  Pulmonary:     Effort: Pulmonary effort is normal. No respiratory distress.  Breath sounds: Normal breath sounds.  Abdominal:     General: Abdomen is flat. Bowel sounds are normal.     Palpations: Abdomen is soft.  Musculoskeletal:        General: Normal range of motion.     Cervical back: Normal range of motion.  Skin:    General: Skin is warm and dry.     Capillary Refill: Capillary refill takes less than 2 seconds.  Neurological:     General: No focal deficit present.     Mental Status: She is alert and oriented to person, place, and time. Mental status is at baseline.  Psychiatric:        Mood and Affect: Mood normal.        Behavior: Behavior normal.        Thought Content: Thought content normal.        Judgment: Judgment normal.      Impression and Plan:  Encounter for preventive health examination  Hypothyroidism, unspecified type - Plan: CBC with Differential/Platelet, Comprehensive metabolic panel,  Hemoglobin A1c, Lipid panel, Vitamin B12, TSH  Vitamin D deficiency - Plan: VITAMIN D 25 Hydroxy (Vit-D Deficiency, Fractures)  -Recommend routine eye and dental care. -Immunizations: Declines flu and COVID despite counseling -Healthy lifestyle discussed in detail. -Labs to be updated today. -Colon cancer screening: 10/2020 -Breast cancer screening: With GYN in 2023, obtain records -Cervical cancer screening: With GYN in 2023, obtain records -Lung cancer screening: Not applicable -Prostate cancer screening: Not applicable -DEXA: Not applicable      Elizette Shek Isaac Bliss, MD [facility] at [facility]

## 2022-10-17 ENCOUNTER — Other Ambulatory Visit (INDEPENDENT_AMBULATORY_CARE_PROVIDER_SITE_OTHER): Payer: BC Managed Care – PPO

## 2022-10-17 ENCOUNTER — Other Ambulatory Visit: Payer: Self-pay | Admitting: *Deleted

## 2022-10-17 DIAGNOSIS — R7302 Impaired glucose tolerance (oral): Secondary | ICD-10-CM

## 2022-10-17 DIAGNOSIS — E785 Hyperlipidemia, unspecified: Secondary | ICD-10-CM

## 2022-10-17 DIAGNOSIS — E559 Vitamin D deficiency, unspecified: Secondary | ICD-10-CM

## 2022-10-17 DIAGNOSIS — R718 Other abnormality of red blood cells: Secondary | ICD-10-CM | POA: Diagnosis not present

## 2022-10-17 LAB — IBC + FERRITIN
Ferritin: 137.8 ng/mL (ref 10.0–291.0)
Iron: 72 ug/dL (ref 42–145)
Saturation Ratios: 19.1 % — ABNORMAL LOW (ref 20.0–50.0)
TIBC: 376.6 ug/dL (ref 250.0–450.0)
Transferrin: 269 mg/dL (ref 212.0–360.0)

## 2022-10-24 ENCOUNTER — Telehealth: Payer: Self-pay | Admitting: Internal Medicine

## 2022-10-24 NOTE — Telephone Encounter (Signed)
Pt was seen on 10/15/22. Pt is concerned that she has not been eating/loss of appetite. Pt called to request a call back to discuss her lab results.

## 2022-10-25 NOTE — Telephone Encounter (Signed)
Spoke with patient. Reviewed lab results.  Follow up appointment scheduled.

## 2022-10-25 NOTE — Telephone Encounter (Signed)
Attempted to call the patient.  Unable to leave a message.

## 2022-12-28 ENCOUNTER — Other Ambulatory Visit: Payer: Self-pay | Admitting: Internal Medicine

## 2023-01-04 ENCOUNTER — Other Ambulatory Visit: Payer: Self-pay | Admitting: Internal Medicine

## 2023-01-04 DIAGNOSIS — E559 Vitamin D deficiency, unspecified: Secondary | ICD-10-CM

## 2023-01-08 ENCOUNTER — Telehealth: Payer: Self-pay | Admitting: Internal Medicine

## 2023-01-08 NOTE — Telephone Encounter (Signed)
Pt is calling and she would like to know if she suppose to be still be taking VIT D2

## 2023-01-09 NOTE — Telephone Encounter (Signed)
Called and informed the patient that she will need her Vit D levels checked.  Patient will call back and schedule a lab appointment.

## 2023-01-14 ENCOUNTER — Other Ambulatory Visit (INDEPENDENT_AMBULATORY_CARE_PROVIDER_SITE_OTHER): Payer: BC Managed Care – PPO

## 2023-01-14 DIAGNOSIS — E785 Hyperlipidemia, unspecified: Secondary | ICD-10-CM | POA: Diagnosis not present

## 2023-01-14 DIAGNOSIS — E559 Vitamin D deficiency, unspecified: Secondary | ICD-10-CM | POA: Diagnosis not present

## 2023-01-14 DIAGNOSIS — R7302 Impaired glucose tolerance (oral): Secondary | ICD-10-CM

## 2023-01-14 LAB — HEMOGLOBIN A1C: Hgb A1c MFr Bld: 6 % (ref 4.6–6.5)

## 2023-01-14 LAB — LIPID PANEL
Cholesterol: 182 mg/dL (ref 0–200)
HDL: 46.5 mg/dL (ref >39.00)
LDL Cholesterol: 115 mg/dL — ABNORMAL HIGH (ref 0–99)
NonHDL: 135.69
Total CHOL/HDL Ratio: 4
Triglycerides: 102 mg/dL (ref 0.0–149.0)
VLDL: 20.4 mg/dL (ref 0.0–40.0)

## 2023-01-14 LAB — VITAMIN D 25 HYDROXY (VIT D DEFICIENCY, FRACTURES): VITD: 34.82 ng/mL (ref 30.00–100.00)

## 2023-01-22 ENCOUNTER — Telehealth: Payer: Self-pay | Admitting: Internal Medicine

## 2023-01-22 NOTE — Telephone Encounter (Signed)
Requesting a call to discuss labs 

## 2023-01-23 NOTE — Telephone Encounter (Signed)
Spoke to patient and reviewed lab results 

## 2023-02-05 DIAGNOSIS — J040 Acute laryngitis: Secondary | ICD-10-CM | POA: Diagnosis not present

## 2023-02-25 ENCOUNTER — Other Ambulatory Visit: Payer: Self-pay | Admitting: Internal Medicine

## 2023-04-15 ENCOUNTER — Ambulatory Visit: Payer: Self-pay | Admitting: Internal Medicine

## 2023-04-15 DIAGNOSIS — R7302 Impaired glucose tolerance (oral): Secondary | ICD-10-CM

## 2023-04-22 ENCOUNTER — Encounter: Payer: Self-pay | Admitting: Internal Medicine

## 2023-04-22 ENCOUNTER — Ambulatory Visit (INDEPENDENT_AMBULATORY_CARE_PROVIDER_SITE_OTHER): Payer: Self-pay | Admitting: Internal Medicine

## 2023-04-22 VITALS — BP 110/80 | HR 76 | Temp 98.2°F | Wt 146.3 lb

## 2023-04-22 DIAGNOSIS — R7302 Impaired glucose tolerance (oral): Secondary | ICD-10-CM

## 2023-04-22 DIAGNOSIS — E785 Hyperlipidemia, unspecified: Secondary | ICD-10-CM

## 2023-04-22 DIAGNOSIS — E039 Hypothyroidism, unspecified: Secondary | ICD-10-CM

## 2023-04-22 DIAGNOSIS — R918 Other nonspecific abnormal finding of lung field: Secondary | ICD-10-CM | POA: Insufficient documentation

## 2023-04-22 LAB — POCT GLYCOSYLATED HEMOGLOBIN (HGB A1C): Hemoglobin A1C: 6 % — AB (ref 4.0–5.6)

## 2023-04-22 NOTE — Assessment & Plan Note (Signed)
Not currently on medication, monitoring with lifestyle changes.

## 2023-04-22 NOTE — Progress Notes (Signed)
     Established Patient Office Visit     CC/Reason for Visit: Follow-up chronic conditions  HPI: [name] is a 57 y.o. female who is coming in today for the above mentioned reasons. Past Medical History is significant for: Hyperlipidemia, vitamin D deficiency, iron deficiency, impaired glucose tolerance, hypothyroidism.  She has been dealing with some tailbone pain.  Was prescribed Flexeril that helps some as well as a donut pillow.   Past Medical/Surgical History: Past Medical History:  Diagnosis Date   Chiari I malformation ([facility])    Dizziness 01/2020   Endometriosis 1990   Hyperglycemia    Jejunitis    Ringing in ears, bilateral 01/2020   Small bowel obstruction ([facility]) 05/13/2020    Past Surgical History:  Procedure Laterality Date   ABDOMINAL HYSTERECTOMY  1995   endometriosis   APPENDECTOMY  1995   at time of TAH for endometriosis   SMALL INTESTINE SURGERY  1998   ex lap, SB resection for SBO    Social History:  reports that she has never smoked. She has never used smokeless tobacco. She reports that she does not currently use alcohol. She reports that she does not use drugs.  Allergies: Allergies  Allergen Reactions   Anaprox [Naproxen] Swelling and Other (See Comments)    swelling and redness    Family History:  Family History  Problem Relation Age of Onset   Liver cancer Mother    Cancer Mother        liver cancer    Diabetes Father    Hypertension Father    Cancer Sister      Current Outpatient Medications:    cyclobenzaprine (FLEXERIL) 10 MG tablet, Take 10 mg by mouth 3 (three) times daily as needed., Disp: , Rfl:    levothyroxine (SYNTHROID) 50 MCG tablet, Take 1 tablet (50 mcg total) by mouth daily before breakfast., Disp: 90 tablet, Rfl: 1   meclizine (ANTIVERT) 25 MG tablet, Take 1 tablet by mouth once daily, Disp: 30 tablet, Rfl: 0  Review of Systems:  Negative unless indicated in HPI.   Physical Exam: Vitals:   04/22/23 0822   BP: 110/80  Pulse: 76  Temp: 98.2 F (36.8 C)  TempSrc: Oral  SpO2: 99%  Weight: 146 lb 4.8 oz (66.4 kg)    Body mass index is 25.51 kg/m.   Physical Exam   Impression and Plan:  Hyperlipidemia, unspecified hyperlipidemia type Assessment & Plan: Not currently on medication, monitoring with lifestyle changes.  Orders: -     Lipid panel; Future  Hypothyroidism, unspecified type Assessment & Plan: Most recent TSH within range on levothyroxine 50 mcg.  Orders: -     TSH; Future  IGT (impaired glucose tolerance) Assessment & Plan: A1c is stable at 6.0.  Orders: -     POCT glycosylated hemoglobin (Hb A1C)  Pulmonary nodules Assessment & Plan: CT to follow  Orders: -     CT CHEST WO CONTRAST; Future     Time spent:31 minutes reviewing chart, interviewing and examining patient and formulating plan of care.     Chaya Jan, MD [facility] at [facility]

## 2023-04-22 NOTE — Assessment & Plan Note (Signed)
Most recent TSH within range on levothyroxine 50 mcg.

## 2023-04-22 NOTE — Assessment & Plan Note (Signed)
A1c is stable at 6.0.

## 2023-04-22 NOTE — Assessment & Plan Note (Signed)
CT to follow

## 2023-05-10 ENCOUNTER — Telehealth: Payer: Self-pay | Admitting: Internal Medicine

## 2023-05-10 NOTE — Telephone Encounter (Signed)
[name] preservice at [facility] is calling and pt has [name] and need PA for ct chest wo contrast

## 2023-05-13 ENCOUNTER — Ambulatory Visit (HOSPITAL_BASED_OUTPATIENT_CLINIC_OR_DEPARTMENT_OTHER)
Admission: RE | Admit: 2023-05-13 | Discharge: 2023-05-13 | Disposition: A | Discharge status: Home / Self Care | Payer: BC Managed Care – PPO | Source: Ambulatory Visit | Attending: Internal Medicine | Admitting: Internal Medicine

## 2023-05-13 DIAGNOSIS — J8410 Pulmonary fibrosis, unspecified: Secondary | ICD-10-CM | POA: Diagnosis not present

## 2023-05-13 DIAGNOSIS — J984 Other disorders of lung: Secondary | ICD-10-CM | POA: Diagnosis not present

## 2023-05-13 DIAGNOSIS — R918 Other nonspecific abnormal finding of lung field: Secondary | ICD-10-CM | POA: Diagnosis not present

## 2023-06-18 DIAGNOSIS — Z01419 Encounter for gynecological examination (general) (routine) without abnormal findings: Secondary | ICD-10-CM | POA: Diagnosis not present

## 2023-06-18 DIAGNOSIS — Z1231 Encounter for screening mammogram for malignant neoplasm of breast: Secondary | ICD-10-CM | POA: Diagnosis not present

## 2023-06-29 ENCOUNTER — Other Ambulatory Visit: Payer: Self-pay | Admitting: Internal Medicine

## 2023-07-01 NOTE — Telephone Encounter (Signed)
Is this dose ok for pt. I see labs in future placed. Please advise

## 2023-10-01 ENCOUNTER — Other Ambulatory Visit: Payer: Self-pay | Admitting: Internal Medicine

## 2023-10-31 DIAGNOSIS — Z6825 Body mass index (BMI) 25.0-25.9, adult: Secondary | ICD-10-CM | POA: Diagnosis not present

## 2023-10-31 DIAGNOSIS — J029 Acute pharyngitis, unspecified: Secondary | ICD-10-CM | POA: Diagnosis not present

## 2023-11-04 ENCOUNTER — Ambulatory Visit (INDEPENDENT_AMBULATORY_CARE_PROVIDER_SITE_OTHER): Payer: BC Managed Care – PPO | Admitting: Internal Medicine

## 2023-11-04 ENCOUNTER — Encounter: Payer: Self-pay | Admitting: Internal Medicine

## 2023-11-04 VITALS — BP 120/84 | HR 88 | Temp 98.4°F | Wt 144.2 lb

## 2023-11-04 DIAGNOSIS — M79645 Pain in left finger(s): Secondary | ICD-10-CM | POA: Diagnosis not present

## 2023-11-04 DIAGNOSIS — J069 Acute upper respiratory infection, unspecified: Secondary | ICD-10-CM | POA: Diagnosis not present

## 2023-11-04 MED ORDER — MECLIZINE HCL 25 MG PO TABS
25.0000 mg | ORAL_TABLET | Freq: Every day | ORAL | 0 refills | Status: AC
Start: 2023-11-04 — End: ?

## 2023-11-04 NOTE — Progress Notes (Signed)
 Established Patient Office Visit     CC/Reason for Visit: URI symptom  HPI: [name] is a 58 y.o. female who is coming in today for the above mentioned reasons.  She is having symptoms on February 6.  Consisting of cough productive of clear sputum.  She went to urgent care on that same day was diagnosed with a left ear infection and bronchitis and was given a Z-Pak.  She is here today because she has progressed to having rib cage pain from all the coughing.  She has not tried any over-the-counter medication.  She has also been having pain with extension of her left thumb.  She is used to holding her cell phone in that hand.  She would also like a refill of meclizine  that she uses as needed for chronic dizziness.   Past Medical/Surgical History: Past Medical History:  Diagnosis Date   Chiari I malformation ([facility])    Dizziness 01/2020   Endometriosis 1990   Hyperglycemia    Jejunitis    Ringing in ears, bilateral 01/2020   Small bowel obstruction ([facility]) 05/13/2020    Past Surgical History:  Procedure Laterality Date   ABDOMINAL HYSTERECTOMY  1995   endometriosis   APPENDECTOMY  1995   at time of TAH for endometriosis   SMALL INTESTINE SURGERY  1998   ex lap, SB resection for SBO    Social History:  reports that she has never smoked. She has never used smokeless tobacco. She reports that she does not currently use alcohol. She reports that she does not use drugs.  Allergies: Allergies  Allergen Reactions   Anaprox [Naproxen] Swelling and Other (See Comments)    swelling and redness    Family History:  Family History  Problem Relation Age of Onset   Liver cancer Mother    Cancer Mother        liver cancer    Diabetes Father    Hypertension Father    Cancer Sister      Current Outpatient Medications:    cyclobenzaprine  (FLEXERIL ) 10 MG tablet, Take 10 mg by mouth 3 (three) times daily as needed., Disp: , Rfl:    levothyroxine  (SYNTHROID ) 50 MCG tablet,  TAKE 1 TABLET BY MOUTH ONCE DAILY BEFORE BREAKFAST, Disp: 90 tablet, Rfl: 1   meclizine  (ANTIVERT ) 25 MG tablet, Take 1 tablet (25 mg total) by mouth daily., Disp: 30 tablet, Rfl: 0  Review of Systems:  Negative unless indicated in HPI.   Physical Exam: Vitals:   11/04/23 1418  BP: 120/84  Pulse: 88  Temp: 98.4 F (36.9 C)  TempSrc: Oral  SpO2: 100%  Weight: 144 lb 3.2 oz (65.4 kg)    Body mass index is 25.14 kg/m.   Physical Exam Vitals reviewed.  Constitutional:      Appearance: Normal appearance.  HENT:     Right Ear: Tympanic membrane, ear canal and external ear normal.     Left Ear: Tympanic membrane, ear canal and external ear normal.     Mouth/Throat:     Mouth: Mucous membranes are moist.     Pharynx: Oropharynx is clear.  Eyes:     Conjunctiva/sclera: Conjunctivae normal.     Pupils: Pupils are equal, round, and reactive to light.  Cardiovascular:     Rate and Rhythm: Normal rate and regular rhythm.  Pulmonary:     Effort: Pulmonary effort is normal.     Breath sounds: Normal breath sounds.  Neurological:  Mental Status: She is alert.      Impression and Plan:  URI with cough and congestion  Thumb pain, left  Other orders -     Meclizine  HCl; Take 1 tablet (25 mg total) by mouth daily.  Dispense: 30 tablet; Refill: 0   -Given exam findings, PNA, pharyngitis, ear infection are not likely, hence abx have not been prescribed. -Have advised rest, fluids, OTC antihistamines, cough suppressants and mucinex. -RTC if no improvement in 10-14 days. -Meclizine  refilled. -Suspect she has a left thumb extensor tendinitis.  We had discussed meloxicam  but she has a severe allergy to naproxen so I would rather avoid.   Time spent:30 minutes reviewing chart, interviewing and examining patient and formulating plan of care.     Marguerita Shih, MD [place] at [facility]

## 2023-11-14 ENCOUNTER — Telehealth: Payer: Self-pay

## 2023-11-14 NOTE — Telephone Encounter (Signed)
 Copied from CRM [id number]. Topic: Clinical - Prescription Issue >> Nov 14, 2023  9:29 AM Kathryne Eriksson wrote: Reason for CRM: Missing Medication >> Nov 14, 2023  9:31 AM Kathryne Eriksson wrote: Patient states when she was seen by Dr. Ardyth Harps, she stated she would send patient 2 prescriptions to the pharmacy. One of the medications were meclizine (ANTIVERT) 25 MG tablet , patient states she doesn't know the name of the other but it was for inflammation, that prescription is the one she's missing / needing.

## 2023-11-18 NOTE — Telephone Encounter (Signed)
 Patient is aware.  Patient would like to know what should she take for her pain?

## 2023-11-18 NOTE — Telephone Encounter (Signed)
 Patient states that she has tried Tylenol and it does not help.  Please advise.

## 2023-11-19 ENCOUNTER — Other Ambulatory Visit: Payer: Self-pay | Admitting: Internal Medicine

## 2023-11-19 DIAGNOSIS — M79645 Pain in left finger(s): Secondary | ICD-10-CM

## 2023-11-19 MED ORDER — MELOXICAM 7.5 MG PO TABS
7.5000 mg | ORAL_TABLET | Freq: Every day | ORAL | 0 refills | Status: AC
Start: 2023-11-19 — End: ?

## 2023-11-19 NOTE — Telephone Encounter (Signed)
 Spoke to the patient and she is willing to try meloxicam.

## 2024-02-18 ENCOUNTER — Ambulatory Visit (INDEPENDENT_AMBULATORY_CARE_PROVIDER_SITE_OTHER): Admitting: Internal Medicine

## 2024-02-18 ENCOUNTER — Encounter: Payer: Self-pay | Admitting: Internal Medicine

## 2024-02-18 VITALS — BP 120/84 | HR 64 | Temp 97.7°F | Ht 63.5 in | Wt 150.5 lb

## 2024-02-18 DIAGNOSIS — Z0000 Encounter for general adult medical examination without abnormal findings: Secondary | ICD-10-CM | POA: Diagnosis not present

## 2024-02-18 DIAGNOSIS — E039 Hypothyroidism, unspecified: Secondary | ICD-10-CM | POA: Diagnosis not present

## 2024-02-18 DIAGNOSIS — R7302 Impaired glucose tolerance (oral): Secondary | ICD-10-CM | POA: Diagnosis not present

## 2024-02-18 DIAGNOSIS — Z1159 Encounter for screening for other viral diseases: Secondary | ICD-10-CM

## 2024-02-18 DIAGNOSIS — E785 Hyperlipidemia, unspecified: Secondary | ICD-10-CM | POA: Diagnosis not present

## 2024-02-18 DIAGNOSIS — E559 Vitamin D deficiency, unspecified: Secondary | ICD-10-CM

## 2024-02-18 DIAGNOSIS — Z23 Encounter for immunization: Secondary | ICD-10-CM

## 2024-02-18 NOTE — Addendum Note (Signed)
 Addended by: Nicolina Barrios B on: 02/18/2024 02:46 PM   Modules accepted: Orders

## 2024-02-18 NOTE — Progress Notes (Signed)
 Established Patient Office Visit     CC/Reason for Visit: Annual preventive exam  HPI: [name] is a 58 y.o. female who is coming in today for the above mentioned reasons. Past Medical History is significant for: Hyperlipidemia, vitamin D  deficiency, impaired glucose tolerance, hypothyroidism, iron  deficiency.  Routine eye and dental care.  No acute concerns or complaints.   Past Medical/Surgical History: Past Medical History:  Diagnosis Date   Chiari I malformation ([facility])    Dizziness 01/2020   Endometriosis 1990   Hyperglycemia    Jejunitis    Ringing in ears, bilateral 01/2020   Small bowel obstruction ([facility]) 05/13/2020    Past Surgical History:  Procedure Laterality Date   ABDOMINAL HYSTERECTOMY  1995   endometriosis   APPENDECTOMY  1995   at time of TAH for endometriosis   SMALL INTESTINE SURGERY  1998   ex lap, SB resection for SBO    Social History:  reports that she has never smoked. She has never used smokeless tobacco. She reports that she does not currently use alcohol. She reports that she does not use drugs.  Allergies: Allergies  Allergen Reactions   Anaprox [Naproxen] Swelling and Other (See Comments)    swelling and redness    Family History:  Family History  Problem Relation Age of Onset   Liver cancer Mother    Cancer Mother        liver cancer    Diabetes Father    Hypertension Father    Cancer Sister      Current Outpatient Medications:    cyclobenzaprine  (FLEXERIL ) 10 MG tablet, Take 10 mg by mouth 3 (three) times daily as needed., Disp: , Rfl:    levothyroxine  (SYNTHROID ) 50 MCG tablet, TAKE 1 TABLET BY MOUTH ONCE DAILY BEFORE BREAKFAST, Disp: 90 tablet, Rfl: 1   meclizine  (ANTIVERT ) 25 MG tablet, Take 1 tablet (25 mg total) by mouth daily. (Patient not taking: Reported on 02/18/2024), Disp: 30 tablet, Rfl: 0   meloxicam  (MOBIC ) 7.5 MG tablet, Take 1 tablet (7.5 mg total) by mouth daily. (Patient not taking: Reported on  02/18/2024), Disp: 30 tablet, Rfl: 0  Review of Systems:  Negative unless indicated in HPI.   Physical Exam: Vitals:   02/18/24 1349  BP: 120/84  Pulse: 64  Temp: 97.7 F (36.5 C)  TempSrc: Oral  SpO2: 100%  Weight: 150 lb 8 oz (68.3 kg)  Height: 5' 3.5" (1.613 m)    Body mass index is 26.24 kg/m.   Physical Exam Vitals reviewed.  Constitutional:      General: She is not in acute distress.    Appearance: Normal appearance. She is not ill-appearing, toxic-appearing or diaphoretic.  HENT:     Head: Normocephalic.     Right Ear: Tympanic membrane, ear canal and external ear normal. There is no impacted cerumen.     Left Ear: Tympanic membrane, ear canal and external ear normal. There is no impacted cerumen.     Nose: Nose normal.     Mouth/Throat:     Mouth: Mucous membranes are moist.     Pharynx: Oropharynx is clear. No oropharyngeal exudate or posterior oropharyngeal erythema.  Eyes:     General: No scleral icterus.       Right eye: No discharge.        Left eye: No discharge.     Conjunctiva/sclera: Conjunctivae normal.     Pupils: Pupils are equal, round, and reactive to light.  Neck:  Vascular: No carotid bruit.  Cardiovascular:     Rate and Rhythm: Normal rate and regular rhythm.     Pulses: Normal pulses.     Heart sounds: Normal heart sounds.  Pulmonary:     Effort: Pulmonary effort is normal. No respiratory distress.     Breath sounds: Normal breath sounds.  Abdominal:     General: Abdomen is flat. Bowel sounds are normal.     Palpations: Abdomen is soft.  Musculoskeletal:        General: Normal range of motion.     Cervical back: Normal range of motion.  Skin:    General: Skin is warm and dry.  Neurological:     General: No focal deficit present.     Mental Status: She is alert and oriented to person, place, and time. Mental status is at baseline.  Psychiatric:        Mood and Affect: Mood normal.        Behavior: Behavior normal.         Thought Content: Thought content normal.        Judgment: Judgment normal.     Impression and Plan:  Encounter for preventive health examination  Hyperlipidemia, unspecified hyperlipidemia type -     CBC with Differential/Platelet; Future -     Comprehensive metabolic panel with GFR; Future -     Lipid panel; Future  Hypothyroidism, unspecified type -     TSH; Future  IGT (impaired glucose tolerance) -     Vitamin B12; Future -     Hemoglobin A1c; Future  Vitamin D  deficiency -     VITAMIN D  25 Hydroxy (Vit-D Deficiency, Fractures); Future  Encounter for hepatitis C screening test for low risk patient -     Hepatitis C antibody; Future  Immunization due   -Recommend routine eye and dental care. -Healthy lifestyle discussed in detail. -Labs to be updated today. -Prostate cancer screening: N/A Health Maintenance  Topic Date Due   Hepatitis C Screening  Never done   COVID-19 Vaccine (4 - 2024-25 season) 05/26/2023   Flu Shot  04/24/2024   Mammogram  06/01/2024   Pap with HPV screening  08/17/2025   DTaP/Tdap/Td vaccine (2 - Td or Tdap) 07/13/2030   Colon Cancer Screening  10/25/2030   HIV Screening  Completed   Zoster (Shingles) Vaccine  Completed   HPV Vaccine  Aged Out   Meningitis B Vaccine  Aged Out     - PCV 20 in office today. - Obtain records from GYN.    Marguerita Shih, MD  Primary Care at [facility]

## 2024-02-19 ENCOUNTER — Other Ambulatory Visit (INDEPENDENT_AMBULATORY_CARE_PROVIDER_SITE_OTHER)

## 2024-02-19 DIAGNOSIS — E039 Hypothyroidism, unspecified: Secondary | ICD-10-CM

## 2024-02-19 DIAGNOSIS — E559 Vitamin D deficiency, unspecified: Secondary | ICD-10-CM

## 2024-02-19 DIAGNOSIS — Z1159 Encounter for screening for other viral diseases: Secondary | ICD-10-CM | POA: Diagnosis not present

## 2024-02-19 DIAGNOSIS — E785 Hyperlipidemia, unspecified: Secondary | ICD-10-CM | POA: Diagnosis not present

## 2024-02-19 DIAGNOSIS — R7302 Impaired glucose tolerance (oral): Secondary | ICD-10-CM | POA: Diagnosis not present

## 2024-02-19 LAB — CBC WITH DIFFERENTIAL/PLATELET
Basophils Absolute: 0 10*3/uL (ref 0.0–0.1)
Basophils Relative: 0.5 % (ref 0.0–3.0)
Eosinophils Absolute: 0.1 10*3/uL (ref 0.0–0.7)
Eosinophils Relative: 1.1 % (ref 0.0–5.0)
HCT: 42 % (ref 36.0–46.0)
Hemoglobin: 13.2 g/dL (ref 12.0–15.0)
Lymphocytes Relative: 13.7 % (ref 12.0–46.0)
Lymphs Abs: 1.2 10*3/uL (ref 0.7–4.0)
MCHC: 31.5 g/dL (ref 30.0–36.0)
MCV: 70.2 fl — ABNORMAL LOW (ref 78.0–100.0)
Monocytes Absolute: 0.6 10*3/uL (ref 0.1–1.0)
Monocytes Relative: 7.1 % (ref 3.0–12.0)
Neutro Abs: 7.1 10*3/uL (ref 1.4–7.7)
Neutrophils Relative %: 77.6 % — ABNORMAL HIGH (ref 43.0–77.0)
Platelets: 254 10*3/uL (ref 150.0–400.0)
RBC: 5.98 Mil/uL — ABNORMAL HIGH (ref 3.87–5.11)
RDW: 15 % (ref 11.5–15.5)
WBC: 9.1 10*3/uL (ref 4.0–10.5)

## 2024-02-19 LAB — LIPID PANEL
Cholesterol: 210 mg/dL — ABNORMAL HIGH (ref 0–200)
HDL: 51.6 mg/dL (ref >39.00)
LDL Cholesterol: 122 mg/dL — ABNORMAL HIGH (ref 0–99)
NonHDL: 158.27
Total CHOL/HDL Ratio: 4
Triglycerides: 180 mg/dL — ABNORMAL HIGH (ref 0.0–149.0)
VLDL: 36 mg/dL (ref 0.0–40.0)

## 2024-02-19 LAB — HEMOGLOBIN A1C: Hgb A1c MFr Bld: 6.1 % (ref 4.6–6.5)

## 2024-02-19 LAB — COMPREHENSIVE METABOLIC PANEL WITH GFR
ALT: 21 U/L (ref 0–35)
AST: 16 U/L (ref 0–37)
Albumin: 4.5 g/dL (ref 3.5–5.2)
Alkaline Phosphatase: 43 U/L (ref 39–117)
BUN: 21 mg/dL (ref 6–23)
CO2: 28 meq/L (ref 19–32)
Calcium: 9.6 mg/dL (ref 8.4–10.5)
Chloride: 103 meq/L (ref 96–112)
Creatinine, Ser: 0.67 mg/dL (ref 0.40–1.20)
GFR: 96.8 mL/min (ref >60.00)
Glucose, Bld: 103 mg/dL — ABNORMAL HIGH (ref 70–99)
Potassium: 3.8 meq/L (ref 3.5–5.1)
Sodium: 139 meq/L (ref 135–145)
Total Bilirubin: 0.8 mg/dL (ref 0.2–1.2)
Total Protein: 7.6 g/dL (ref 6.0–8.3)

## 2024-02-19 LAB — TSH: TSH: 3.12 u[IU]/mL (ref 0.35–5.50)

## 2024-02-19 LAB — VITAMIN B12: Vitamin B-12: 566 pg/mL (ref 211–911)

## 2024-02-19 LAB — VITAMIN D 25 HYDROXY (VIT D DEFICIENCY, FRACTURES): VITD: 19.33 ng/mL — ABNORMAL LOW (ref 30.00–100.00)

## 2024-02-20 LAB — HEPATITIS C ANTIBODY: Hepatitis C Ab: NONREACTIVE

## 2024-02-25 ENCOUNTER — Ambulatory Visit: Payer: Self-pay

## 2024-02-25 ENCOUNTER — Ambulatory Visit: Payer: Self-pay | Admitting: Internal Medicine

## 2024-02-25 DIAGNOSIS — R718 Other abnormality of red blood cells: Secondary | ICD-10-CM

## 2024-02-25 DIAGNOSIS — E559 Vitamin D deficiency, unspecified: Secondary | ICD-10-CM

## 2024-02-25 MED ORDER — VITAMIN D (ERGOCALCIFEROL) 1.25 MG (50000 UNIT) PO CAPS: 50000.0000 [IU] | ORAL_CAPSULE | ORAL | 0 refills | Status: AC

## 2024-02-25 NOTE — Telephone Encounter (Signed)
 See lab result note 02/25/24.

## 2024-02-25 NOTE — Telephone Encounter (Signed)
 Chief Complaint: lab results  Disposition: [] ED /[] Urgent Care (no appt availability in office) / [] Appointment(In office/virtual)/ []  [facility]/ [] Home Care/ [] Refused Recommended Disposition /[] [facility]/ [x]  Follow-up with PCP Additional Notes: pt was calling in to get the results of her labs. Informed pt of lab results. Pt is requesting a call from Dr. Ival Marines.   Copied from CRM [id number]. Topic: Clinical - Lab/Test Results >> Feb 25, 2024  3:56 PM Magdalene School wrote: Reason for CRM: Patient has questions about lab results. Reason for Disposition  Caller requesting lab results  (Exception: Routine or non-urgent lab result.)  Protocols used: PCP Call - No Triage-A-AH

## 2024-02-27 ENCOUNTER — Other Ambulatory Visit (INDEPENDENT_AMBULATORY_CARE_PROVIDER_SITE_OTHER)

## 2024-02-27 DIAGNOSIS — R718 Other abnormality of red blood cells: Secondary | ICD-10-CM

## 2024-02-27 LAB — IBC + FERRITIN
Ferritin: 122.8 ng/mL (ref 10.0–291.0)
Iron: 125 ug/dL (ref 42–145)
Saturation Ratios: 35.6 % (ref 20.0–50.0)
TIBC: 351.4 ug/dL (ref 250.0–450.0)
Transferrin: 251 mg/dL (ref 212.0–360.0)

## 2024-03-10 ENCOUNTER — Telehealth: Payer: Self-pay | Admitting: *Deleted

## 2024-03-10 NOTE — Telephone Encounter (Signed)
 Copied from CRM [id number]. Topic: Clinical - Lab/Test Results >> Mar 10, 2024  3:42 PM Alysia Jumbo S wrote: Reason for CRM: Patient calling to check the status of lab results. No note available at this time. Patient request a callback with results.

## 2024-03-11 NOTE — Telephone Encounter (Signed)
 Patient is aware

## 2024-03-16 ENCOUNTER — Ambulatory Visit: Payer: Self-pay | Admitting: Internal Medicine

## 2024-03-23 ENCOUNTER — Other Ambulatory Visit: Payer: Self-pay | Admitting: Internal Medicine

## 2024-05-20 ENCOUNTER — Telehealth: Payer: Self-pay | Admitting: *Deleted

## 2024-05-20 DIAGNOSIS — E559 Vitamin D deficiency, unspecified: Secondary | ICD-10-CM

## 2024-05-20 NOTE — Telephone Encounter (Signed)
Lab appointment and order placed

## 2024-05-20 NOTE — Telephone Encounter (Signed)
 Copied from CRM [id number]. Topic: Clinical - Medication Question >> May 20, 2024  4:34 PM Chasity T wrote: Reason for CRM: Patient is calling because she has completed the vitamins d2 and wants to know if she needs to continue to take it or just take the d3 600 mg now. Please give a call back to discuss.

## 2024-05-22 ENCOUNTER — Other Ambulatory Visit (INDEPENDENT_AMBULATORY_CARE_PROVIDER_SITE_OTHER)

## 2024-05-22 DIAGNOSIS — E559 Vitamin D deficiency, unspecified: Secondary | ICD-10-CM

## 2024-05-22 LAB — VITAMIN D 25 HYDROXY (VIT D DEFICIENCY, FRACTURES): VITD: 33.44 ng/mL (ref 30.00–100.00)

## 2024-05-27 ENCOUNTER — Ambulatory Visit: Payer: Self-pay | Admitting: Internal Medicine

## 2024-08-31 ENCOUNTER — Ambulatory Visit: Admitting: Internal Medicine

## 2024-09-07 DIAGNOSIS — Z01419 Encounter for gynecological examination (general) (routine) without abnormal findings: Secondary | ICD-10-CM | POA: Diagnosis not present

## 2024-09-07 DIAGNOSIS — Z1231 Encounter for screening mammogram for malignant neoplasm of breast: Secondary | ICD-10-CM | POA: Diagnosis not present

## 2024-09-09 ENCOUNTER — Ambulatory Visit: Admitting: Internal Medicine

## 2024-09-09 ENCOUNTER — Encounter: Payer: Self-pay | Admitting: Internal Medicine

## 2024-09-09 VITALS — BP 110/80 | HR 53 | Temp 98.1°F | Wt 148.9 lb

## 2024-09-09 DIAGNOSIS — G8929 Other chronic pain: Secondary | ICD-10-CM

## 2024-09-09 DIAGNOSIS — E785 Hyperlipidemia, unspecified: Secondary | ICD-10-CM

## 2024-09-09 DIAGNOSIS — M25512 Pain in left shoulder: Secondary | ICD-10-CM | POA: Diagnosis not present

## 2024-09-09 DIAGNOSIS — E559 Vitamin D deficiency, unspecified: Secondary | ICD-10-CM | POA: Diagnosis not present

## 2024-09-09 DIAGNOSIS — E039 Hypothyroidism, unspecified: Secondary | ICD-10-CM | POA: Diagnosis not present

## 2024-09-09 DIAGNOSIS — R7302 Impaired glucose tolerance (oral): Secondary | ICD-10-CM

## 2024-09-09 LAB — POCT GLYCOSYLATED HEMOGLOBIN (HGB A1C): Hemoglobin A1C: 5.8 % — AB (ref 4.0–5.6)

## 2024-09-09 LAB — LIPID PANEL
Cholesterol: 215 mg/dL — ABNORMAL HIGH (ref 28–200)
HDL: 52.6 mg/dL (ref >39.00)
LDL Cholesterol: 133 mg/dL — ABNORMAL HIGH (ref 10–99)
NonHDL: 161.98
Total CHOL/HDL Ratio: 4
Triglycerides: 143 mg/dL (ref 10.0–149.0)
VLDL: 28.6 mg/dL (ref 0.0–40.0)

## 2024-09-09 LAB — VITAMIN D 25 HYDROXY (VIT D DEFICIENCY, FRACTURES): VITD: 24.88 ng/mL — ABNORMAL LOW (ref 30.00–100.00)

## 2024-09-09 LAB — TSH: TSH: 3.78 u[IU]/mL (ref 0.35–5.50)

## 2024-09-09 NOTE — Progress Notes (Signed)
 Established Patient Office Visit     CC/Reason for Visit: Follow-up chronic medical conditions  HPI: [name] is a 58 y.o. female who is coming in today for the above mentioned reasons. Past Medical History is significant for: Hyperlipidemia, vitamin D  deficiency, impaired glucose tolerance, iron  deficiency anemia, hypothyroidism.  She has been complaining of left shoulder pain now for a few months reaching above her head is difficult.  Requesting orthopedic referral.  Meloxicam  helps significantly.   Past Medical/Surgical History: Past Medical History:  Diagnosis Date   Chiari I malformation (HCC)    Dizziness 01/2020   Endometriosis 1990   Hyperglycemia    Jejunitis    Ringing in ears, bilateral 01/2020   Small bowel obstruction (HCC) 05/13/2020    Past Surgical History:  Procedure Laterality Date   ABDOMINAL HYSTERECTOMY  1995   endometriosis   APPENDECTOMY  1995   at time of TAH for endometriosis   SMALL INTESTINE SURGERY  1998   ex lap, SB resection for SBO    Social History:  reports that she has never smoked. She has never used smokeless tobacco. She reports that she does not currently use alcohol. She reports that she does not use drugs.  Allergies: Allergies[1]  Family History:  Family History  Problem Relation Age of Onset   Liver cancer Mother    Cancer Mother        liver cancer    Diabetes Father    Hypertension Father    Cancer Sister     Current Medications[2]  Review of Systems:  Negative unless indicated in HPI.   Physical Exam: Vitals:   09/09/24 0811  BP: 110/80  Pulse: (!) 53  Temp: 98.1 F (36.7 C)  TempSrc: Oral  SpO2: 98%  Weight: 148 lb 14.4 oz (67.5 kg)    Body mass index is 25.96 kg/m.   Physical Exam Vitals reviewed.  Constitutional:      Appearance: Normal appearance.  HENT:     Head: Normocephalic and atraumatic.  Eyes:     Conjunctiva/sclera: Conjunctivae normal.  Cardiovascular:     Rate and  Rhythm: Normal rate and regular rhythm.  Pulmonary:     Effort: Pulmonary effort is normal.     Breath sounds: Normal breath sounds.  Skin:    General: Skin is warm and dry.  Neurological:     General: No focal deficit present.     Mental Status: She is alert and oriented to person, place, and time.  Psychiatric:        Mood and Affect: Mood normal.        Behavior: Behavior normal.        Thought Content: Thought content normal.        Judgment: Judgment normal.      Impression and Plan:  IGT (impaired glucose tolerance) -     POCT glycosylated hemoglobin (Hb A1C)  Hyperlipidemia, unspecified hyperlipidemia type -     Lipid panel  Hypothyroidism, unspecified type -     TSH; Future  Vitamin D  deficiency -     VITAMIN D  25 Hydroxy (Vit-D Deficiency, Fractures); Future  Chronic left shoulder pain -     Ambulatory referral to Orthopedic Surgery   - A1c is stable to improved at 5.8.  Continue to work on lifestyle changes. - Recheck lipids today. - Check TSH, last TSH was within normal limits at 3.120. - Check vitamin D  levels today. - Refer to orthopedics for chronic left shoulder  pain.  Time spent:32 minutes reviewing chart, interviewing and examining patient and formulating plan of care.     Tully Theophilus Andrews, MD [facility]     [1]  Allergies Allergen Reactions   Anaprox [Naproxen] Swelling and Other (See Comments)    swelling and redness  [2]  Current Outpatient Medications:    levothyroxine  (SYNTHROID ) 50 MCG tablet, TAKE 1 TABLET BY MOUTH ONCE DAILY BEFORE BREAKFAST, Disp: 90 tablet, Rfl: 1   meloxicam  (MOBIC ) 7.5 MG tablet, Take 1 tablet (7.5 mg total) by mouth daily., Disp: 30 tablet, Rfl: 0

## 2024-09-24 ENCOUNTER — Other Ambulatory Visit: Payer: Self-pay | Admitting: Internal Medicine

## 2024-09-30 ENCOUNTER — Telehealth: Payer: Self-pay | Admitting: *Deleted

## 2024-09-30 NOTE — Telephone Encounter (Signed)
 Copied from CRM [id number]. Topic: Clinical - Lab/Test Results >> Sep 30, 2024  2:21 PM [name] wrote: Reason for CRM: Patient requesting call back for lab results from 09/09/24. Please call her at [phone]

## 2024-10-02 ENCOUNTER — Telehealth: Payer: Self-pay | Admitting: *Deleted

## 2024-10-02 NOTE — Telephone Encounter (Signed)
 Copied from CRM [id number]. Topic: Clinical - Lab/Test Results >> Sep 30, 2024  2:21 PM [name] wrote: Reason for CRM: Patient requesting call back for lab results from 09/09/24. Please call her at [phone] >> Oct 02, 2024  2:25 PM [name] wrote: Patient is calling regarding results please follow up with the patient. Aware of call back time

## 2024-10-05 ENCOUNTER — Ambulatory Visit: Admitting: Orthopedic Surgery

## 2024-10-05 ENCOUNTER — Encounter: Payer: Self-pay | Admitting: Orthopedic Surgery

## 2024-10-05 ENCOUNTER — Other Ambulatory Visit: Payer: Self-pay

## 2024-10-05 DIAGNOSIS — M25512 Pain in left shoulder: Secondary | ICD-10-CM | POA: Diagnosis not present

## 2024-10-05 MED ORDER — MELOXICAM 15 MG PO TABS: ORAL_TABLET | ORAL | 0 refills | Status: AC

## 2024-10-05 NOTE — Progress Notes (Unsigned)
 "  Office Visit Note   Patient: [name]           Date of Birth: Feb 07, 1966           MRN: [id number] Visit Date: 10/05/2024 Requested by: Theophilus Andrews, Tully GRADE, MD [address] [place],  [place] [address] PCP: Theophilus Andrews, Tully GRADE, MD  Subjective: Chief Complaint  Patient presents with   Left Shoulder - Pain    HPI: [name] is a 59 y.o. female who presents to the office reporting left shoulder pain.  Has been going on since November 2025.  Denies any history of injury.  She is right-hand dominant.  Pain is not constant and does not wake her from sleep.  Reports some decreased range of motion in November but it is better now.  Patient states her arm feels heavy at times.  Overhead motion is difficult.  Denies any mechanical symptoms.  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  She is able to sleep on that left-hand side until it hurts..                ROS: All systems reviewed are negative as they relate to the chief complaint within the history of present illness.  Patient denies fevers or chills.  Assessment & Plan: Visit Diagnoses:  1. 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  We talked about an injection today but she also hold off on that.  Will try her on Mobic  as needed for a month and if that does not help then a ultrasound-guided AC joint injection could be likely beneficial.  Radiographs otherwise normal.  She will follow-up with us  in 4 to 6 weeks depending on how she does with the Mobic .  Follow-Up Instructions: No follow-ups on file.   Orders:  Orders Placed This Encounter  Procedures   XR Shoulder Left   Meds ordered this encounter  Medications   meloxicam  (MOBIC ) 15 MG tablet    Sig: 1 po q d    Dispense:  30 tablet    Refill:  0      Procedures: No procedures performed   Clinical Data: No additional findings.  Objective: Vital Signs:  There were no vitals taken for this visit.  Physical Exam:  Constitutional: Patient appears well-developed HEENT:  Head: Normocephalic Eyes:EOM are normal Neck: Normal range of motion Cardiovascular: Normal rate Pulmonary/chest: Effort normal Neurologic: Patient is alert Skin: Skin is warm Psychiatric: Patient has normal mood and affect  Ortho Exam: Ortho exam demonstrates excellent range of motion of both shoulders with good rotator cuff strength infraspinatus supraspinatus and subscap muscle testing.  Patient does have asymmetric tenderness in the [facility] joint left versus right.  Does have some pain with crossarm adduction on the left but not the right.  No coarse grinding or crepitus with internal/external rotation of either arm.  Bilateral shoulder range of motion is 70/100/170.  Negative O'Brien's testing on the left with equivocal speeds testing on the left.  Specialty Comments:  No specialty comments available.  Imaging: XR Shoulder Left Result Date: 10/05/2024 AP axillary outlet radiographs left shoulder reviewed.  Small cystic degenerative arthritic change noted in the [facility] joint.  Visualized lung fields clear.  Acromiohumeral distance maintained.  No degenerative changes in the glenohumeral joint.  Shoulder is located without fracture.    PMFS History: Patient Active Problem List   Diagnosis Date Noted  Pulmonary nodules 04/22/2023   IGT (impaired glucose tolerance) 10/15/2022   Hyperlipidemia 10/15/2022   Vitamin D  deficiency 01/26/2021   Hypothyroidism 01/26/2021   Microcytic erythrocytes 10/13/2020   Colon cancer screening 09/19/2020   SBO (small bowel obstruction) (HCC) 05/13/2020   Ringing in ear, bilateral 02/03/2020   Dizziness 02/03/2020   Hyperglycemia 08/02/2019   Past Medical History:  Diagnosis Date   Chiari I malformation (HCC)    Dizziness 01/2020   Endometriosis 1990   Hyperglycemia    Jejunitis    Ringing in ears, bilateral 01/2020   Small bowel  obstruction (HCC) 05/13/2020    Family History  Problem Relation Age of Onset   Liver cancer Mother    Cancer Mother        liver cancer    Diabetes Father    Hypertension Father    Cancer Sister     Past Surgical History:  Procedure Laterality Date   ABDOMINAL HYSTERECTOMY  1995   endometriosis   APPENDECTOMY  1995   at time of TAH for endometriosis   SMALL INTESTINE SURGERY  1998   ex lap, SB resection for SBO   Social History   Occupational History   Occupation: Lexicographer houses  Tobacco Use   Smoking status: Never   Smokeless tobacco: Never  Vaping Use   Vaping status: Never Used  Substance and Sexual Activity   Alcohol use: Not Currently   Drug use: Never   Sexual activity: Yes        "

## 2024-10-15 ENCOUNTER — Telehealth: Payer: Self-pay | Admitting: *Deleted

## 2024-10-15 NOTE — Telephone Encounter (Signed)
 Copied from CRM #[id number]. Topic: Clinical - Lab/Test Results >> Oct 15, 2024  3:54 PM Harlene ORN wrote: Reason for CRM: Patient called. Had labs done on 09/09/2024. Wanted to follow up on her test results. Please call her back to discuss the results. Spanish Interpretor needed.

## 2024-10-20 ENCOUNTER — Telehealth: Payer: Self-pay | Admitting: Internal Medicine

## 2024-10-20 DIAGNOSIS — E559 Vitamin D deficiency, unspecified: Secondary | ICD-10-CM

## 2024-10-20 DIAGNOSIS — E782 Mixed hyperlipidemia: Secondary | ICD-10-CM

## 2024-10-20 MED ORDER — VITAMIN D (ERGOCALCIFEROL) 1.25 MG (50000 UNIT) PO CAPS: 50000.0000 [IU] | ORAL_CAPSULE | ORAL | 0 refills | Status: AC

## 2024-10-20 MED ORDER — ROSUVASTATIN CALCIUM 5 MG PO TABS: 5.0000 mg | ORAL_TABLET | Freq: Every day | ORAL | 1 refills | Status: AC

## 2024-10-20 NOTE — Telephone Encounter (Signed)
 Sorry for the delay: labs were never routed to me.  -Your Vitamin D  levels are low. Will send in a prescription for Vit D to take 50,000 IU weekly for 12 weeks. Call the office to schedule a follow up vit D level check in 3 months.  -Your cholesterol is high. Start Lifestyle modifications: healthy eating, weight loss, increased physical activity. Recheck lipids in 6 months. I think it is time to start cholesterol medications. If you agree, start rosuvastatin  5 mg daily.  -TSH looks good. Continue current levothyroxine  dose.

## 2024-10-20 NOTE — Telephone Encounter (Signed)
 Patient is aware of lab results.  Patient declines a Rx for lipids.  She will try diet and exercise.

## 2024-10-21 NOTE — Telephone Encounter (Signed)
 Spoke to the patient and reviewed lab results,.
